# Patient Record
Sex: Male | Born: 1945 | Race: White | Hispanic: No | Marital: Married | Smoking: Former smoker
Health system: Southern US, Community
[De-identification: ages and names within clinical notes are randomized; demographics above are authoritative.]

## PROBLEM LIST (undated history)

## (undated) DIAGNOSIS — H269 Unspecified cataract: Secondary | ICD-10-CM

## (undated) DIAGNOSIS — F419 Anxiety disorder, unspecified: Secondary | ICD-10-CM

## (undated) DIAGNOSIS — I719 Aortic aneurysm of unspecified site, without rupture: Secondary | ICD-10-CM

## (undated) DIAGNOSIS — E785 Hyperlipidemia, unspecified: Secondary | ICD-10-CM

## (undated) DIAGNOSIS — E079 Disorder of thyroid, unspecified: Secondary | ICD-10-CM

## (undated) DIAGNOSIS — R011 Cardiac murmur, unspecified: Secondary | ICD-10-CM

## (undated) DIAGNOSIS — N289 Disorder of kidney and ureter, unspecified: Secondary | ICD-10-CM

## (undated) DIAGNOSIS — K635 Polyp of colon: Secondary | ICD-10-CM

## (undated) DIAGNOSIS — I10 Essential (primary) hypertension: Secondary | ICD-10-CM

## (undated) DIAGNOSIS — T7840XA Allergy, unspecified, initial encounter: Secondary | ICD-10-CM

## (undated) HISTORY — PX: CATARACT EXTRACTION: SUR2

## (undated) HISTORY — PX: EYE SURGERY: SHX253

## (undated) HISTORY — DX: Allergy, unspecified, initial encounter: T78.40XA

## (undated) HISTORY — DX: Aortic aneurysm of unspecified site, without rupture: I71.9

## (undated) HISTORY — DX: Disorder of thyroid, unspecified: E07.9

## (undated) HISTORY — PX: VASECTOMY: SHX75

## (undated) HISTORY — PX: ABDOMINAL AORTIC ANEURYSM REPAIR: SUR1152

## (undated) HISTORY — DX: Essential (primary) hypertension: I10

## (undated) HISTORY — DX: Anxiety disorder, unspecified: F41.9

## (undated) HISTORY — DX: Disorder of kidney and ureter, unspecified: N28.9

## (undated) HISTORY — DX: Hyperlipidemia, unspecified: E78.5

## (undated) HISTORY — DX: Polyp of colon: K63.5

## (undated) HISTORY — PX: APPENDECTOMY: SHX54

## (undated) HISTORY — DX: Unspecified cataract: H26.9

## (undated) HISTORY — DX: Cardiac murmur, unspecified: R01.1

---

## 2004-02-12 ENCOUNTER — Emergency Department: Payer: Self-pay | Admitting: Emergency Medicine

## 2004-10-18 ENCOUNTER — Other Ambulatory Visit: Payer: Self-pay

## 2004-10-18 ENCOUNTER — Emergency Department: Payer: Self-pay | Admitting: Unknown Physician Specialty

## 2005-01-29 ENCOUNTER — Other Ambulatory Visit: Payer: Self-pay

## 2005-01-29 ENCOUNTER — Emergency Department: Payer: Self-pay | Admitting: Emergency Medicine

## 2006-11-07 IMAGING — CT CT HEAD WITHOUT CONTRAST
2 series · 16 of 30 positions shown, 20 images · non-contrast
Comparison: none

REASON FOR EXAM: sob  near syncope  rm19
COMMENTS:

PROCEDURE:     CT  - CT HEAD WITHOUT CONTRAST  - October 18, 2004  [DATE]
RESULT:       No intra-axial or extra-axial pathologic fluid or blood
collections are identified.  No mass lesion is noted.
Initial report was given by the [HOSPITAL] at the time of the study.

[Series 2: without · axial · non-contrast · 0.44mm/px · z∈[-222,-88]mm · 13 of 33 slices shown, 17 images]
[im 3/33  brain]
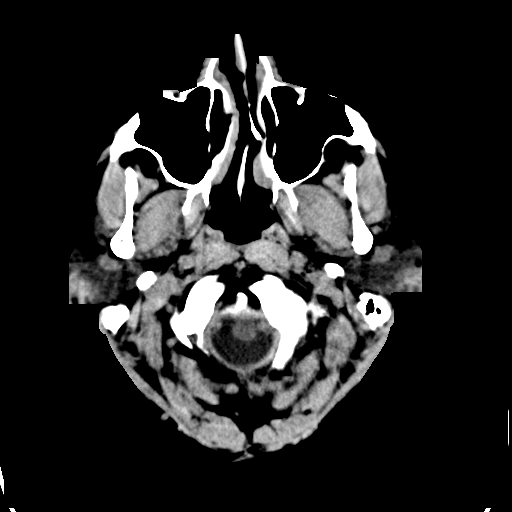
[im 3/33  bone]
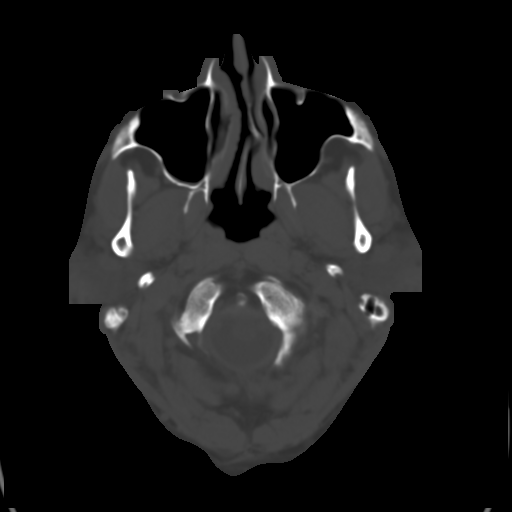
[im 5/33  brain]
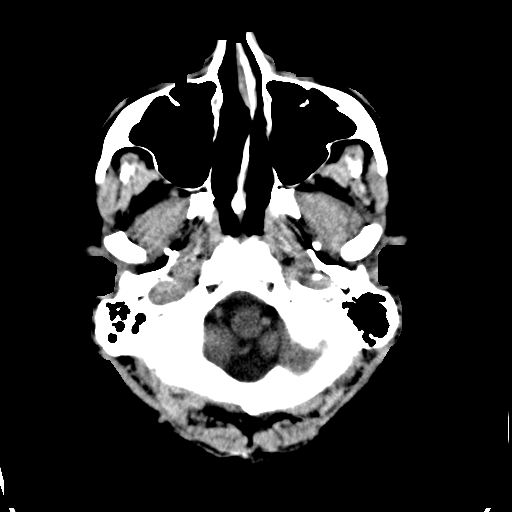
[im 7/33  brain]
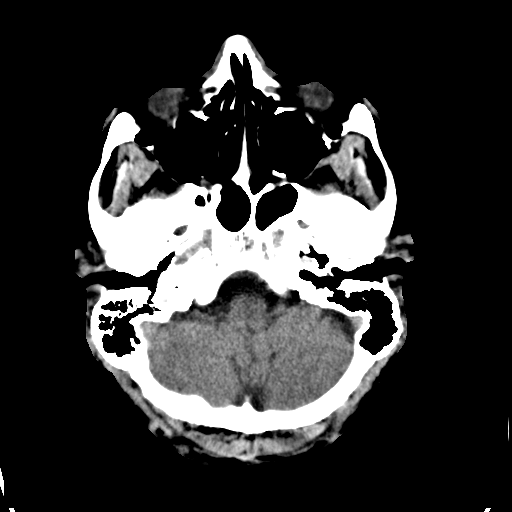
[im 10/33  brain]
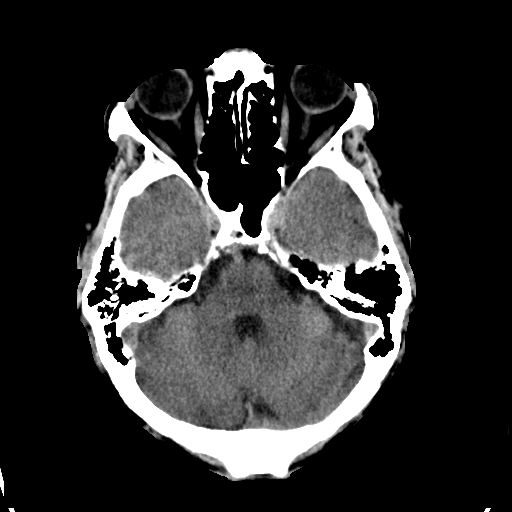
[im 12/33  brain]
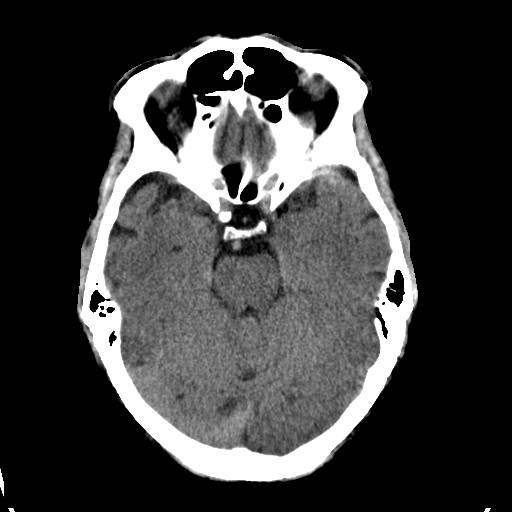
[im 12/33  bone]
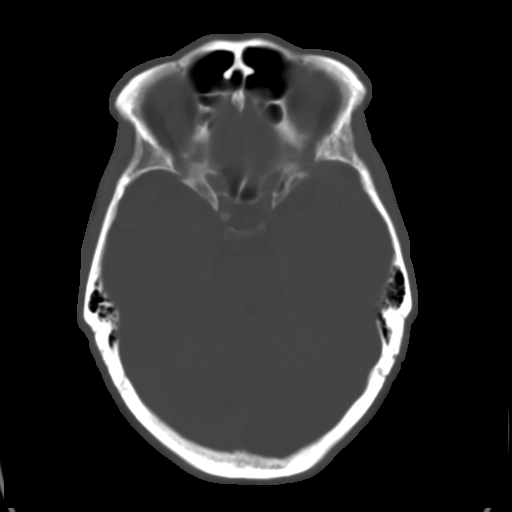
[im 14/33  brain]
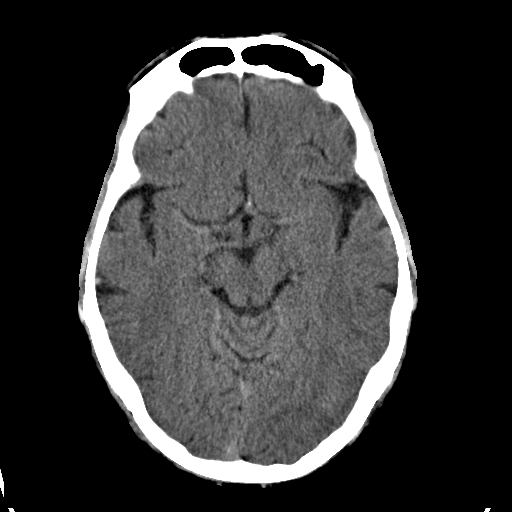
[im 17/33  brain]
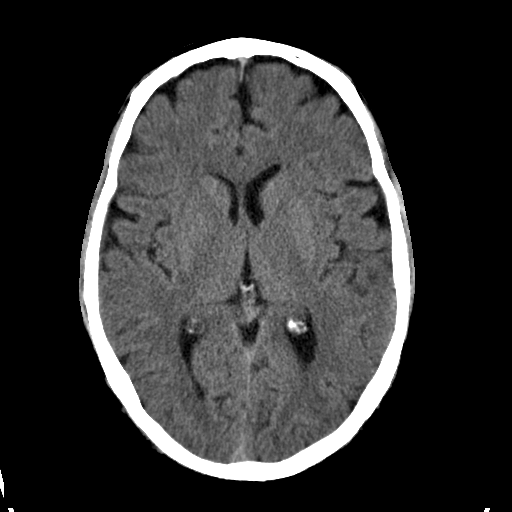
[im 19/33  brain]
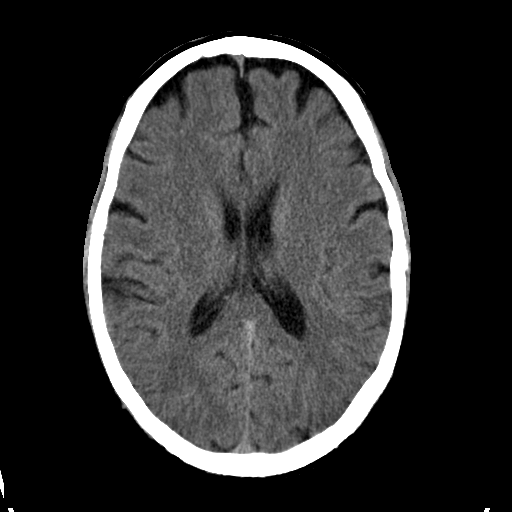
[im 21/33  brain]
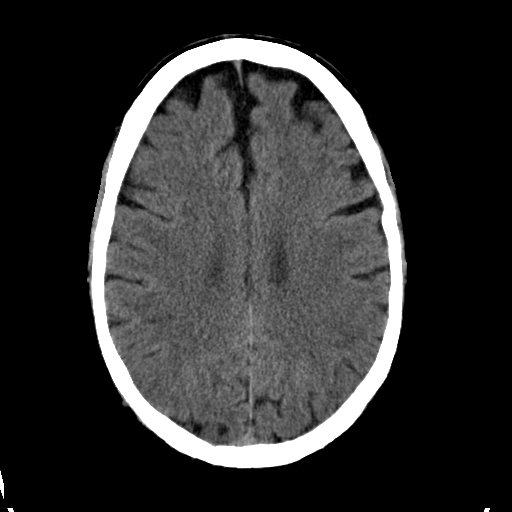
[im 21/33  bone]
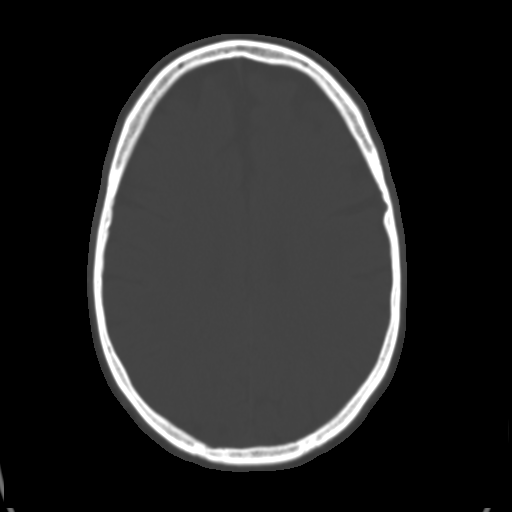
[im 23/33  brain]
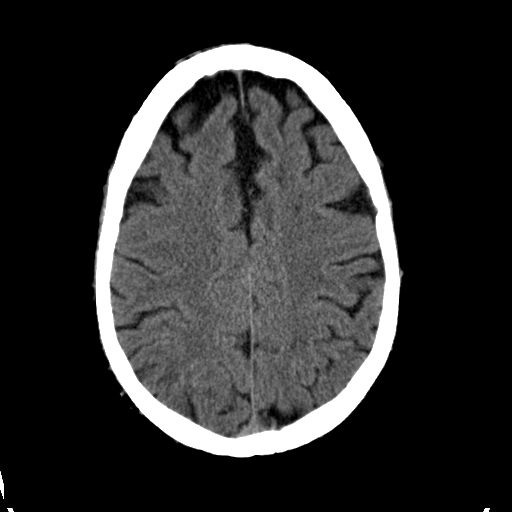
[im 26/33  brain]
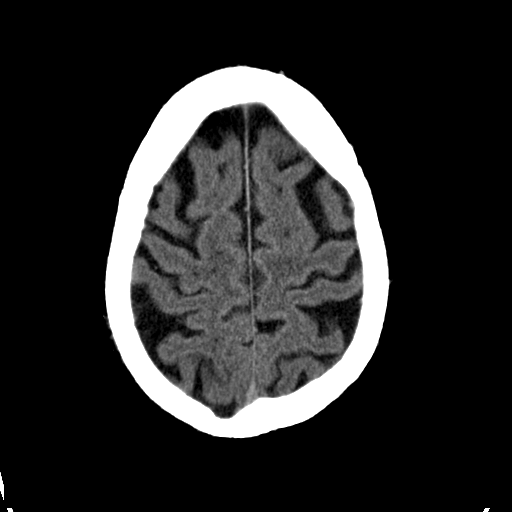
[im 28/33  brain]
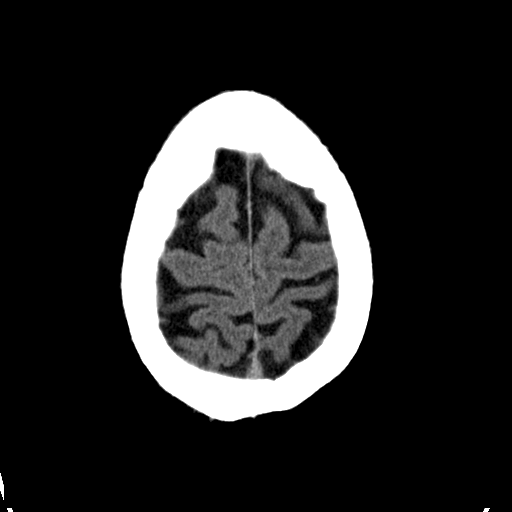
[im 30/33  brain]
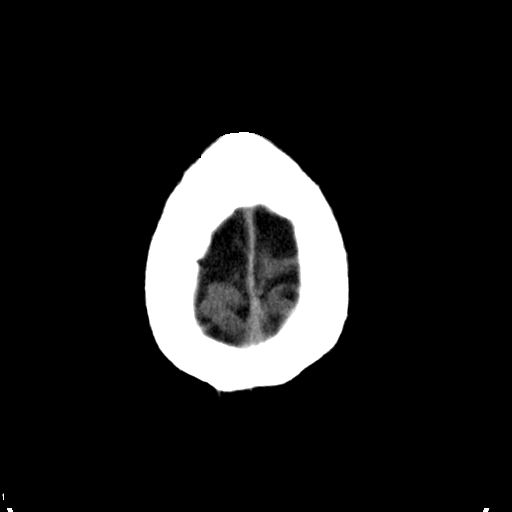
[im 30/33  bone]
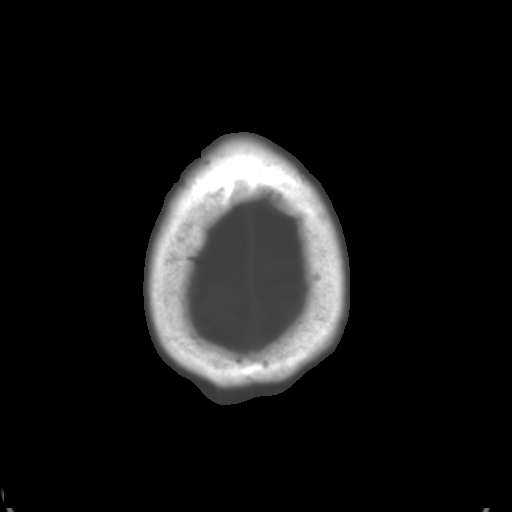

[Series 3: bone windows · axial · 0.44mm/px · z∈[-222,-178]mm · 3 of 33 slices shown]
[im 3/33  bone]
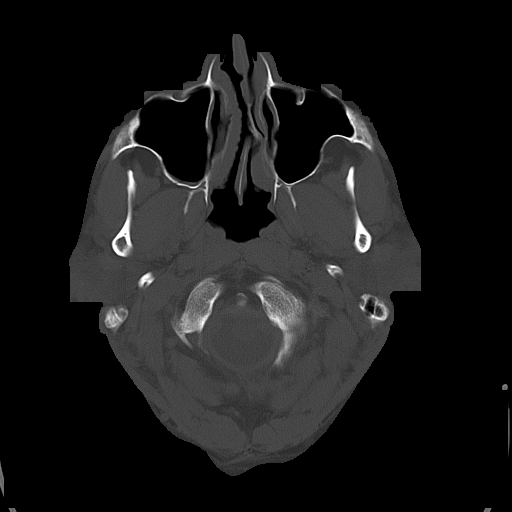
[im 7/33  bone]
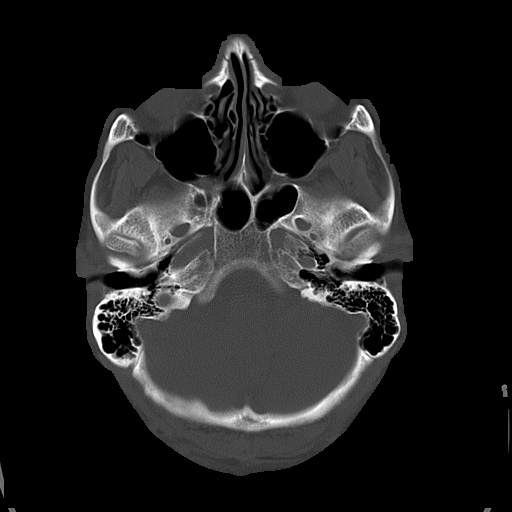
[im 12/33  bone]
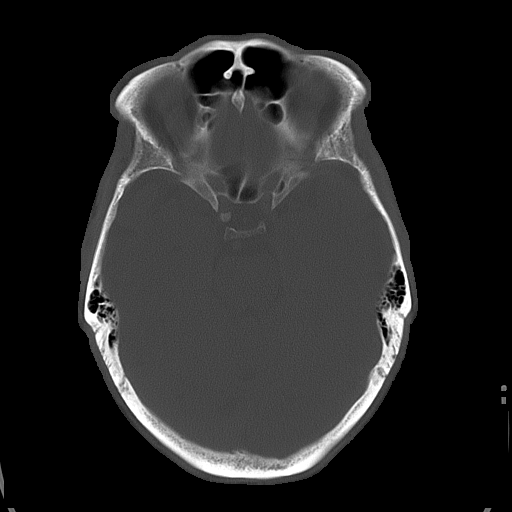

[16 of 30 positions shown; findings below may reference images not displayed]

IMPRESSION: No acute intracranial abnormalities identified.

## 2007-08-04 ENCOUNTER — Ambulatory Visit: Payer: Self-pay | Admitting: Unknown Physician Specialty

## 2009-03-16 ENCOUNTER — Emergency Department: Payer: Self-pay | Admitting: Emergency Medicine

## 2010-04-05 IMAGING — CR DG CHEST 2V
1 series · 2 of 2 positions shown · non-contrast
Comparison: none

REASON FOR EXAM: injury
COMMENTS:   LMP: (Male)

PROCEDURE:     DXR - DXR CHEST PA (OR AP) AND LATERAL  - March 16, 2009 [DATE]
RESULT:     The lung fields are clear. The heart, mediastinal and osseous
structures show no acute changes.

[Series 1: view not recorded · 0.17mm/px · 2 of 2 slices shown]
[im 1/2]
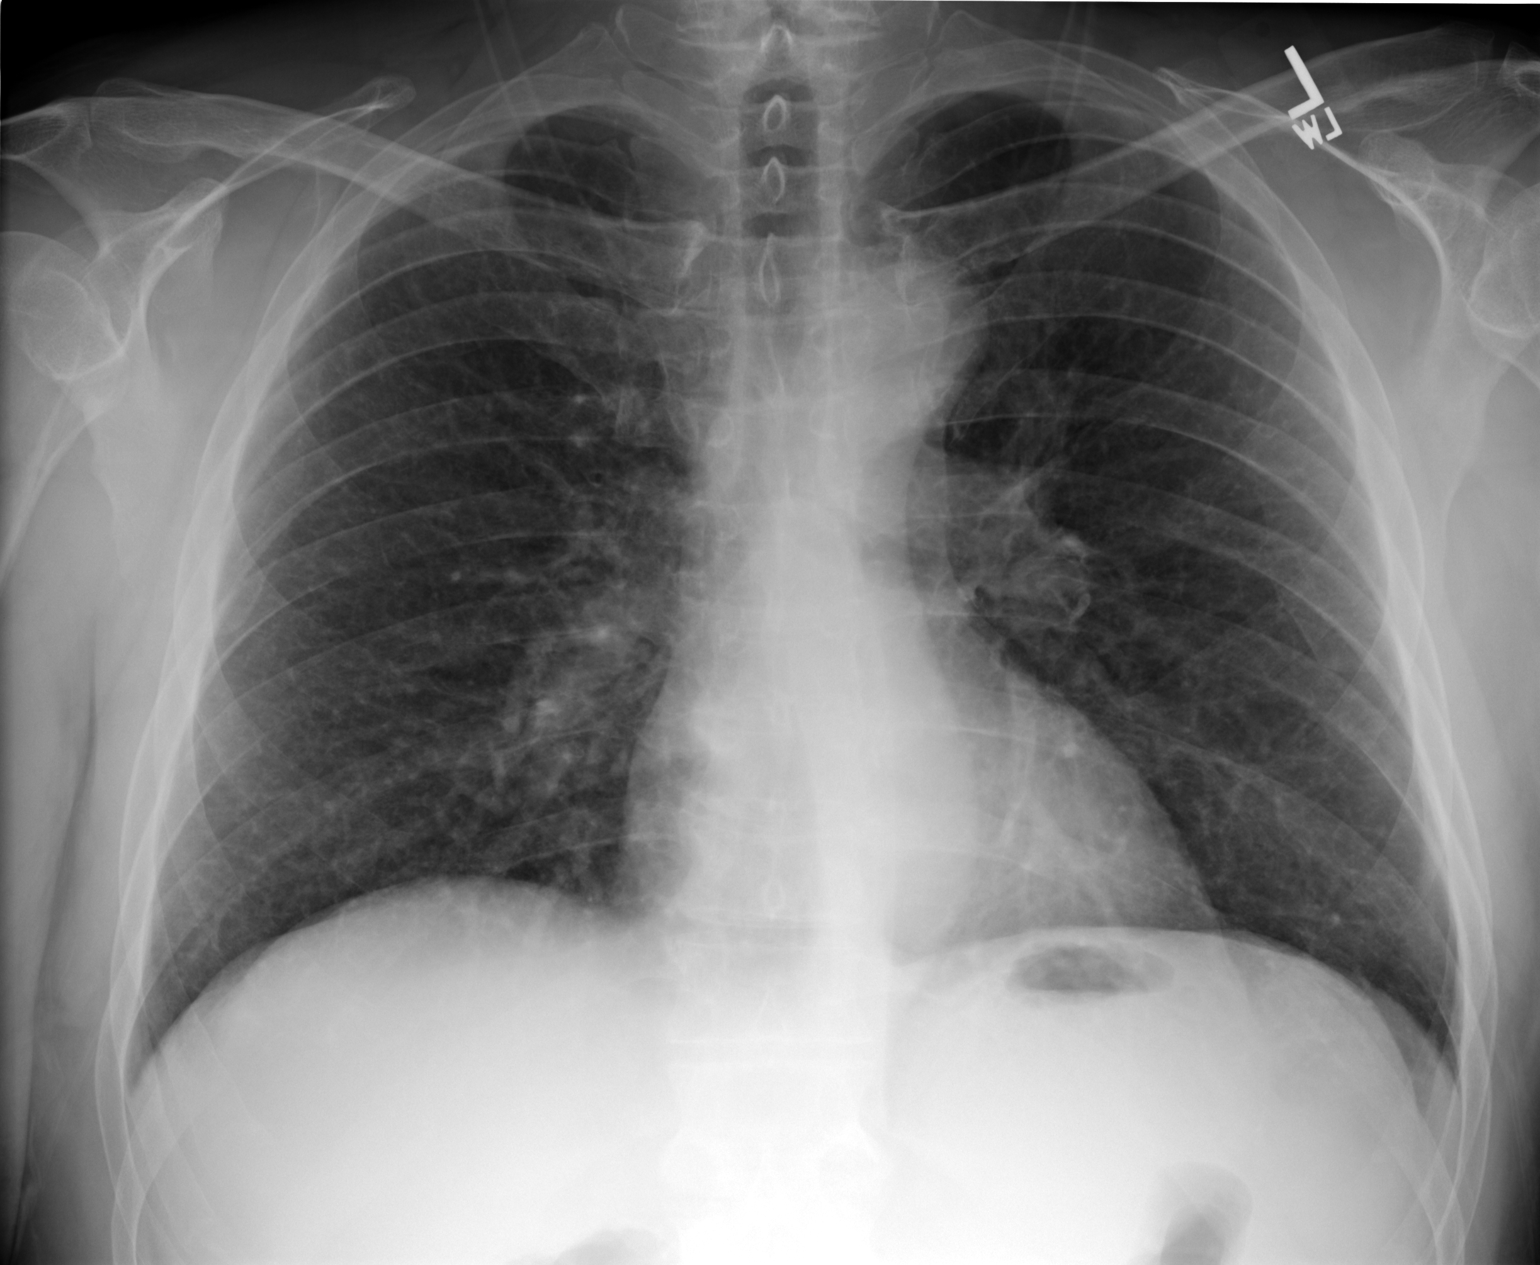
[im 2/2]
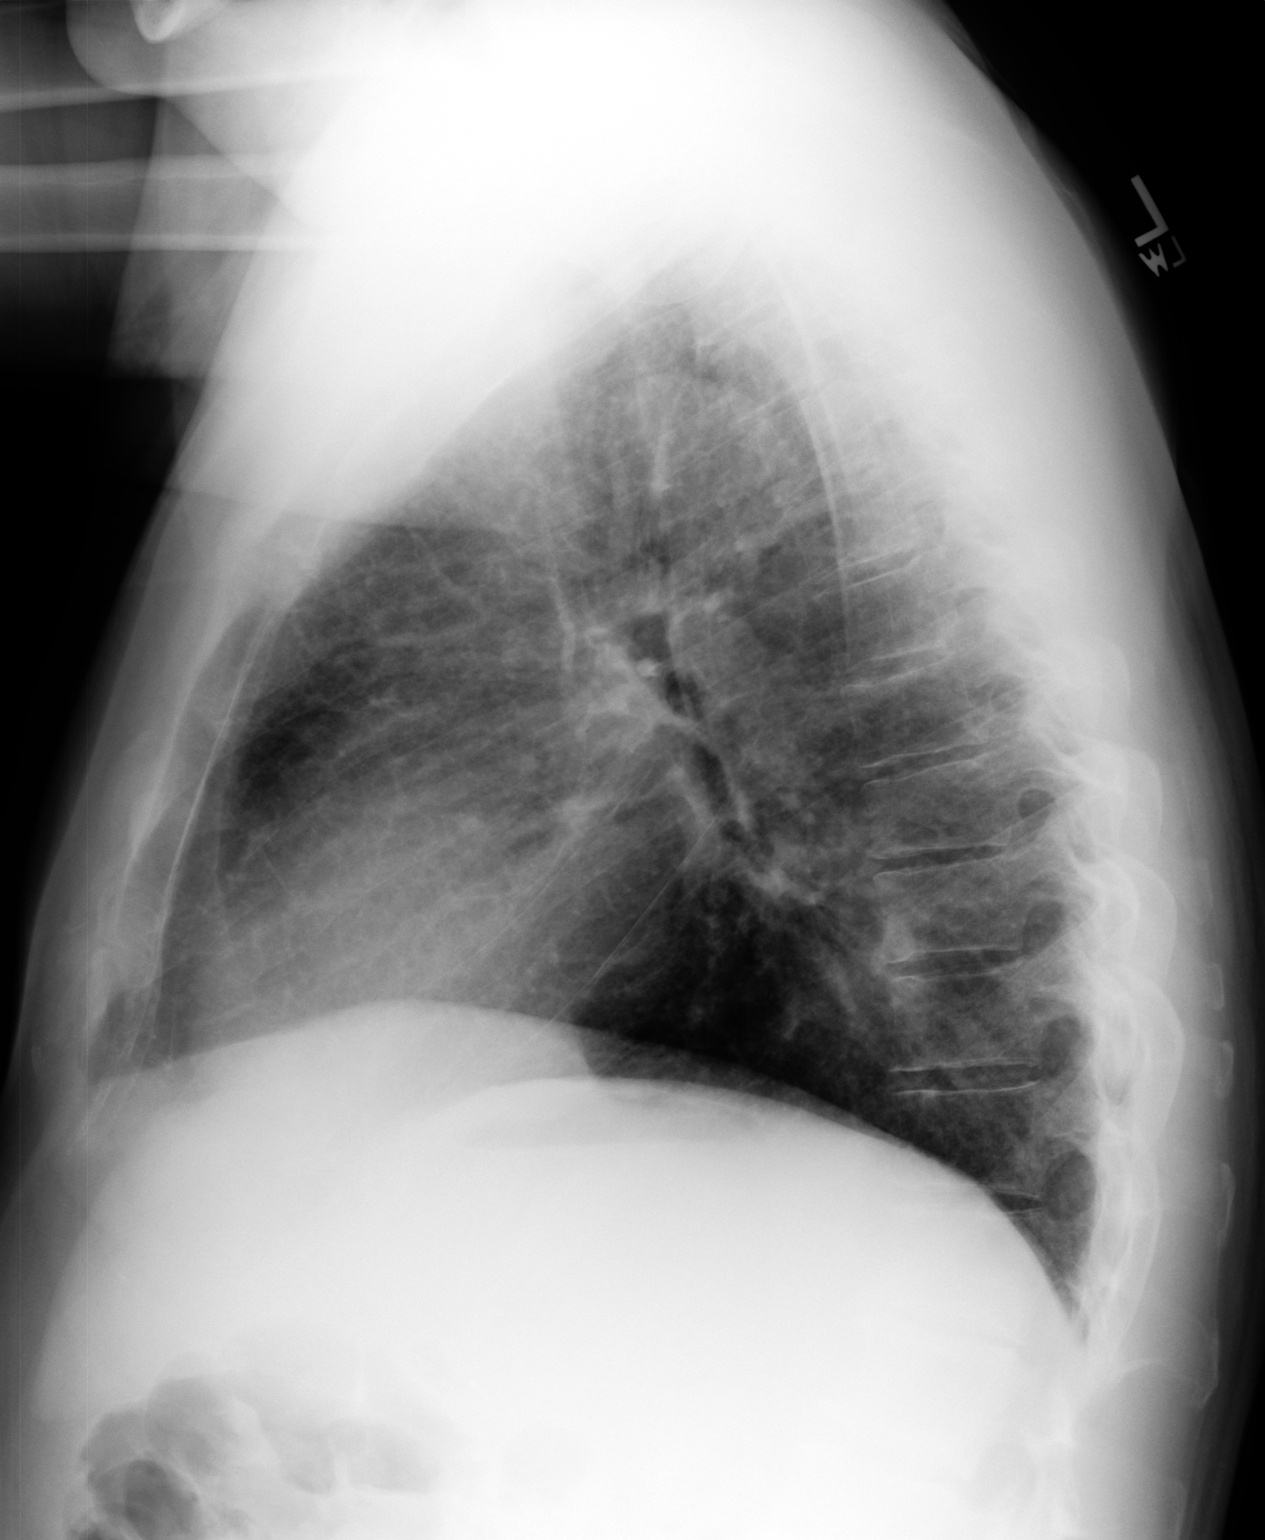

[2 of 2 positions shown; findings below may reference images not displayed]

IMPRESSION: 1.     No significant abnormalities are noted.

## 2015-03-08 DIAGNOSIS — H9193 Unspecified hearing loss, bilateral: Secondary | ICD-10-CM | POA: Insufficient documentation

## 2017-04-16 HISTORY — PX: ABDOMINAL AORTIC ANEURYSM REPAIR: SUR1152

## 2021-02-08 LAB — HM COLONOSCOPY

## 2022-02-15 ENCOUNTER — Ambulatory Visit: Payer: Self-pay | Admitting: Medical-Surgical

## 2022-02-15 ENCOUNTER — Encounter: Payer: Self-pay | Admitting: Family Medicine

## 2022-02-15 ENCOUNTER — Ambulatory Visit (INDEPENDENT_AMBULATORY_CARE_PROVIDER_SITE_OTHER): Payer: Federal, State, Local not specified - PPO | Admitting: Family Medicine

## 2022-02-15 VITALS — BP 133/83 | HR 80 | Ht 72.0 in | Wt 222.0 lb

## 2022-02-15 DIAGNOSIS — Z23 Encounter for immunization: Secondary | ICD-10-CM | POA: Diagnosis not present

## 2022-02-15 DIAGNOSIS — I10 Essential (primary) hypertension: Secondary | ICD-10-CM

## 2022-02-15 DIAGNOSIS — R7303 Prediabetes: Secondary | ICD-10-CM | POA: Diagnosis not present

## 2022-02-15 DIAGNOSIS — E039 Hypothyroidism, unspecified: Secondary | ICD-10-CM | POA: Insufficient documentation

## 2022-02-15 DIAGNOSIS — N1831 Chronic kidney disease, stage 3a: Secondary | ICD-10-CM

## 2022-02-15 NOTE — Assessment & Plan Note (Signed)
-   per patient report. Likely related to hx of htn  - will obtain CMP to further evaluate kidney disease  - pt is already on lisinopril which is kidney protective

## 2022-02-15 NOTE — Progress Notes (Signed)
New Patient Office Visit  Subjective    Patient ID: Earl [name], male    DOB: 07-31-1945  Age: 76 y.o. MRN: [id number]  CC:  Chief Complaint  Patient presents with   Establish Care    HPI Earl [name] presents to establish care.  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   fluticasone (FLONASE) 50 MCG/ACT nasal spray Place 1 spray into both nostrils daily.   hydrochlorothiazide (HYDRODIURIL) 25 MG tablet Take 25 mg by mouth daily.   levothyroxine (SYNTHROID) 88 MCG tablet Take 88 mcg by mouth daily.   lisinopril (ZESTRIL) 40 MG tablet Take 1 tablet by mouth daily.   lovastatin (MEVACOR) 40 MG tablet Take 1 tablet by mouth at bedtime.   sildenafil (VIAGRA) 100 MG tablet Take by mouth.   [DISCONTINUED] quinapril (ACCUPRIL) 40 MG tablet Take by mouth.   No facility-administered encounter medications on file as of 02/15/2022.    Past Medical History:  Diagnosis Date   Aorta aneurysm ([place])    Colon polyp    Hyperlipidemia    Hypertension    Kidney disease      Family History  Problem Relation Age of Onset   Diabetes Other     Social History   Socioeconomic History   Marital status: Married    Spouse name: Not on file   Number of children: Not on file   Years of education: Not on file   Highest education level: Not on file  Occupational History   Not on file  Tobacco Use   Smoking status: Not on  file   Smokeless tobacco: Not on file  Substance and Sexual Activity   Alcohol use: Not on file   Drug use: Not on file   Sexual activity: Not on file  Other Topics Concern   Not on file  Social History Narrative   Not on file   Social Determinants of Health   Financial Resource Strain: Not on file  Food Insecurity: Not on file  Transportation Needs: Not on file  Physical Activity: Not on file  Stress: Not on file  Social Connections: Not on file  Intimate Partner Violence: Not on file    Review of Systems  Constitutional:  Negative for chills and fever.  Respiratory:  Negative for cough and shortness of breath.   Cardiovascular:  Negative for chest pain.  Neurological:  Negative for headaches.        Objective    BP 133/83   Pulse 80   Ht 6' (1.829 m)   Wt 222 lb (100.7 kg)   SpO2 98%   BMI 30.11 kg/m   Physical Exam Vitals and nursing note reviewed.  Constitutional:      General: He is not in acute distress.    Appearance: Normal appearance.  HENT:     Head: Normocephalic and  atraumatic.     Right Ear: External ear normal.     Left Ear: External ear normal.     Nose: Nose normal.  Eyes:     Conjunctiva/sclera: Conjunctivae normal.  Cardiovascular:     Rate and Rhythm: Normal rate and regular rhythm.  Pulmonary:     Effort: Pulmonary effort is normal.     Breath sounds: Normal breath sounds.  Neurological:     General: No focal deficit present.     Mental Status: He is alert and oriented to person, place, and time.  Psychiatric:        Mood and Affect: Mood normal.        Behavior: Behavior normal.        Thought Content: Thought content normal.        Judgment: Judgment normal.        Assessment & Plan:   Problem List Items Addressed This Visit       Cardiovascular and Mediastinum   Primary hypertension - Primary    - BP well controlled will continue current management plan  - order CBC, CMP, lipid panel      Relevant Medications    sildenafil (VIAGRA) 100 MG tablet   lovastatin (MEVACOR) 40 MG tablet   lisinopril (ZESTRIL) 40 MG tablet   hydrochlorothiazide (HYDRODIURIL) 25 MG tablet   aspirin 81 MG chewable tablet   Other Relevant Orders   COMPLETE METABOLIC PANEL WITH GFR   Lipid panel     Endocrine   Acquired hypothyroidism    - secondary to agent orange in [place] - have ordered TSH to evaluate and make sure patient is on appropriate dosing - does not currently have signs of thyroid dysfunction      Relevant Medications   levothyroxine (SYNTHROID) 88 MCG tablet   Other Relevant Orders   TSH + free T4     Genitourinary   Stage 3a chronic kidney disease (HCC)    - per patient report. Likely related to hx of htn  - will obtain CMP to further evaluate kidney disease  - pt is already on lisinopril which is kidney protective      Relevant Orders   COMPLETE METABOLIC PANEL WITH GFR     Other   Prediabetes    - last A1c was 5.7. will obtain A1c to make sure levels are stable  - continue diet and exercise control.      Relevant Orders   HgB A1c   Lipid panel   Other Visit Diagnoses     Flu vaccine need       Relevant Orders   Flu Vaccine QUAD 6+ mos PF IM (Fluarix Quad PF) (Completed)       Return in about 6 months (around 08/16/2022).   Charlton Amor, DO

## 2022-02-15 NOTE — Assessment & Plan Note (Signed)
-   BP well controlled will continue current management plan  - order CBC, CMP, lipid panel

## 2022-02-15 NOTE — Assessment & Plan Note (Signed)
-   last A1c was 5.7. will obtain A1c to make sure levels are stable  - continue diet and exercise control.

## 2022-02-15 NOTE — Assessment & Plan Note (Signed)
-   secondary to agent orange in [place] - have ordered TSH to evaluate and make sure patient is on appropriate dosing - does not currently have signs of thyroid dysfunction

## 2022-02-16 LAB — COMPLETE METABOLIC PANEL WITHOUT GFR
AG Ratio: 1.8 (calc) (ref 1.0–2.5)
ALT: 24 U/L (ref 9–46)
AST: 25 U/L (ref 10–35)
Albumin: 4.9 g/dL (ref 3.6–5.1)
Alkaline phosphatase (APISO): 56 U/L (ref 35–144)
BUN/Creatinine Ratio: 11 (calc) (ref 6–22)
BUN: 16 mg/dL (ref 7–25)
CO2: 29 mmol/L (ref 20–32)
Calcium: 10.3 mg/dL (ref 8.6–10.3)
Chloride: 100 mmol/L (ref 98–110)
Creat: 1.47 mg/dL — ABNORMAL HIGH (ref 0.70–1.28)
Globulin: 2.7 g/dL (ref 1.9–3.7)
Glucose, Bld: 115 mg/dL — ABNORMAL HIGH (ref 65–99)
Potassium: 4.5 mmol/L (ref 3.5–5.3)
Sodium: 140 mmol/L (ref 135–146)
Total Bilirubin: 1.3 mg/dL — ABNORMAL HIGH (ref 0.2–1.2)
Total Protein: 7.6 g/dL (ref 6.1–8.1)
eGFR: 49 mL/min/{1.73_m2} — ABNORMAL LOW

## 2022-02-16 LAB — TSH+FREE T4: TSH W/REFLEX TO FT4: 1.61 mIU/L (ref 0.40–4.50)

## 2022-02-16 LAB — LIPID PANEL
Cholesterol: 171 mg/dL
HDL: 39 mg/dL — ABNORMAL LOW
LDL Cholesterol (Calc): 102 mg/dL — ABNORMAL HIGH
Non-HDL Cholesterol (Calc): 132 mg/dL — ABNORMAL HIGH
Total CHOL/HDL Ratio: 4.4 (calc)
Triglycerides: 179 mg/dL — ABNORMAL HIGH

## 2022-02-16 LAB — HEMOGLOBIN A1C
Hgb A1c MFr Bld: 5.6 % of total Hgb (ref <5.7)
Mean Plasma Glucose: 114 mg/dL
eAG (mmol/L): 6.3 mmol/L

## 2022-03-15 ENCOUNTER — Ambulatory Visit (INDEPENDENT_AMBULATORY_CARE_PROVIDER_SITE_OTHER): Payer: Federal, State, Local not specified - PPO | Admitting: Family Medicine

## 2022-03-15 VITALS — BP 161/89 | HR 70 | Resp 20 | Ht 72.0 in | Wt 225.0 lb

## 2022-03-15 DIAGNOSIS — Z23 Encounter for immunization: Secondary | ICD-10-CM | POA: Diagnosis not present

## 2022-03-15 NOTE — Progress Notes (Signed)
Patient here for Tdap.  Location: Right Deltoid   Medical screening examination/treatment was performed by qualified clinical staff member and as supervising physician I was immediately available for consultation/collaboration. I have reviewed documentation and agree with assessment and plan.  Colbert Coyer. Tamera Punt, DO

## 2022-06-09 ENCOUNTER — Other Ambulatory Visit: Payer: Self-pay | Admitting: Family Medicine

## 2022-06-09 DIAGNOSIS — E034 Atrophy of thyroid (acquired): Secondary | ICD-10-CM

## 2022-07-05 ENCOUNTER — Ambulatory Visit
Admission: EM | Admit: 2022-07-05 | Discharge: 2022-07-05 | Disposition: A | Discharge status: Home / Self Care | Payer: Medicare Other | Attending: Family Medicine | Admitting: Family Medicine

## 2022-07-05 DIAGNOSIS — R55 Syncope and collapse: Secondary | ICD-10-CM | POA: Diagnosis not present

## 2022-07-05 DIAGNOSIS — R000 Tachycardia, unspecified: Secondary | ICD-10-CM | POA: Diagnosis not present

## 2022-07-05 DIAGNOSIS — R030 Elevated blood-pressure reading, without diagnosis of hypertension: Secondary | ICD-10-CM

## 2022-07-05 DIAGNOSIS — I10 Essential (primary) hypertension: Secondary | ICD-10-CM

## 2022-07-05 DIAGNOSIS — F418 Other specified anxiety disorders: Secondary | ICD-10-CM

## 2022-07-05 NOTE — ED Triage Notes (Addendum)
Pt states he's been under a lot of stress due to being in court past several weeks. Also having trouble sleeping, hasn't slept in two days. Says he feels like his heart is racing. Hx of HTN, irregular heartbeat and murmur.

## 2022-07-05 NOTE — ED Provider Notes (Addendum)
[facility]    CSN: [id number] Arrival date & time: 07/05/22  1127      History   Chief Complaint Chief Complaint  Patient presents with   Anxiety    "Feels like heart is racing"    HPI Earl [name] is a 77 y.o. male.   HPI  History of hypertension, hyperlipidemia, aortic aneurysm repair, kidney disease.  States that he is involved in a prolonged court case that he finds extremely stressful and upsetting.  He describes his court involvement as causing "PTSD".  This is not formally diagnosed. Patient states that when he has to go to court he cannot sleep for a couple days before he goes in.  The last 2 times he is gone and he develops tachycardia with a very rapid heartbeat, and "the room goes dark".  He has not fainted but he feels like he is about to.  He does not have any chest pain or shortness of breath.  Does not have a history of coronary artery disease. He is here today because he had to leave work early.  He presents with tachycardia and mild elevated blood pressure.  Past Medical History:  Diagnosis Date   Aorta aneurysm [place] [facility])    Colon polyp    Hyperlipidemia    Hypertension    Kidney disease     Patient Active Problem List   Diagnosis Date Noted   Primary hypertension 02/15/2022   Prediabetes 02/15/2022   Acquired hypothyroidism 02/15/2022   Stage 3a chronic kidney disease ([place]) 02/15/2022    Past Surgical History:  Procedure Laterality Date   VASECTOMY         Home Medications    Prior to Admission medications   Medication Sig Start Date End Date Taking? Authorizing Provider  RESTASIS 0.05 % ophthalmic emulsion Place 1 drop into both eyes 2 (two) times daily. 06/29/22  Yes [provider]  amLODipine (NORVASC) 10 MG tablet Take 10 mg by mouth daily.    [provider]  aspirin 81 MG chewable tablet Chew by mouth.    [provider]  fluticasone (FLONASE) 50 MCG/ACT nasal spray Place 1 spray into both nostrils  daily. 08/20/21   [provider]  hydrochlorothiazide (HYDRODIURIL) 25 MG tablet Take 25 mg by mouth daily.    [provider]  levothyroxine (SYNTHROID) 88 MCG tablet TAKE 1 TABLET BY MOUTH EVERY DAY 06/11/22   Elmo Putt S, DO  lisinopril (ZESTRIL) 40 MG tablet Take 1 tablet by mouth daily. 12/19/21   [provider]  lovastatin (MEVACOR) 40 MG tablet Take 1 tablet by mouth at bedtime. 04/29/07   [provider]  sildenafil (VIAGRA) 100 MG tablet Take by mouth.    [provider]    Family History Family History  Problem Relation Age of Onset   Diabetes Other     Social History Social History   Tobacco Use   Smoking status: Former    Packs/day: 1.00    Years: 15.00    Additional pack years: 0.00    Total pack years: 15.00    Types: Cigarettes    Start date: 2002    Quit date: 2017    Years since quitting: 7.2   Smokeless tobacco: Never  Substance Use Topics   Alcohol use: Yes   Drug use: Never     Allergies   Niacin   Review of Systems Review of Systems See HPI  Physical Exam Triage Vital Signs ED Triage Vitals  Enc Vitals Group     BP 07/05/22 1137 (!) 159/90     Pulse Rate 07/05/22 1137 (!) 102     Resp 07/05/22 1137 17     Temp 07/05/22 1137 (!) 97.5 F (36.4 C)     Temp Source 07/05/22 1137 Oral     SpO2 07/05/22 1137 96 %     Weight --      Height --      Head Circumference --      Peak Flow --      Pain Score 07/05/22 1138 0     Pain Loc --      Pain Edu? --      Excl. in [place]? --    No data found.  Updated Vital Signs BP (!) 159/90 (BP Location: Right Arm)   Pulse (!) 102   Temp (!) 97.5 F (36.4 C) (Oral)   Resp 17   SpO2 96%     Physical Exam Constitutional:      General: He is not in acute distress.    Appearance: He is well-developed.     Comments: Anxious/upset  HENT:     Head: Normocephalic and atraumatic.  Eyes:     Conjunctiva/sclera: Conjunctivae normal.     Pupils: Pupils are  equal, round, and reactive to light.  Cardiovascular:     Rate and Rhythm: Normal rate and regular rhythm.     Heart sounds: Normal heart sounds.  Pulmonary:     Effort: Pulmonary effort is normal. No respiratory distress.     Breath sounds: Normal breath sounds.  Abdominal:     General: There is no distension.     Palpations: Abdomen is soft.  Musculoskeletal:        General: Normal range of motion.     Cervical back: Normal range of motion.     Right lower leg: No edema.     Left lower leg: No edema.  Skin:    General: Skin is warm and dry.  Neurological:     Mental Status: He is alert.      UC Treatments / Results  Labs (all labs ordered are listed, but only abnormal results are displayed) Labs Reviewed - No data to display  EKG   Radiology No results found.  Procedures ED EKG  Date/Time: 07/05/2022 12:24 PM  Performed by: Raylene Everts, MD Authorized by: Raylene Everts, MD   ECG interpreted by ED Physician in the absence of a cardiologist: yes   Previous ECG:    Previous ECG:  Compared to current   Similarity:  No change Interpretation:    Interpretation: non-specific     Details:  LAFB Rate:    ECG rate:  93   ECG rate assessment: normal   Rhythm:    Rhythm: sinus rhythm   Ectopy:    Ectopy: none   QRS:    QRS axis:  Normal   QRS intervals:  Normal   QRS conduction: non-specific conduction delay   ST segments:    ST segments:  Normal T waves:    T waves: normal   Q waves:    Abnormal Q-waves: not present    (including critical care time)  Medications Ordered in UC Medications - No data to display  Initial Impression / Assessment and Plan / UC Course  I have reviewed the triage vital signs and the nursing notes.  Pertinent labs & imaging results that were available during my care of the patient  were reviewed by me and considered in my medical decision making (see chart for details).     Vasculopath with tachycardia and  presyncope associated with emotional stress.  He needs additional cardiac testing prior to being cleared to go back into court safely.  I have placed a cardiology referral.  He is advised to follow-up with his primary care doctor. Final Clinical Impressions(s) / UC Diagnoses   Final diagnoses:  Situational anxiety  Tachycardia with hypertension  Elevated blood pressure reading  Pre-syncope     Discharge Instructions      See cardiology as scheduled   ED Prescriptions   None    PDMP not reviewed this encounter.   Raylene Everts, MD 07/05/22 1224    Raylene Everts, MD 07/05/22 1226

## 2022-07-05 NOTE — Discharge Instructions (Signed)
See cardiology as scheduled.

## 2022-07-27 ENCOUNTER — Other Ambulatory Visit: Payer: Self-pay | Admitting: Family Medicine

## 2022-07-27 DIAGNOSIS — E034 Atrophy of thyroid (acquired): Secondary | ICD-10-CM

## 2022-08-14 NOTE — Progress Notes (Signed)
Referring-Yvonne Lily Peer, MD Reason for referral-palpitations  HPI: 77 year old male for evaluation of palpitations at request of Eustace Moore, MD.  Laboratories November 2023 showed TSH 1.61.  Patient has had previous abdominal aortic aneurysm repair.  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  Patient seen recently with complaints of palpitations that occurred in the setting of a prolonged court case.  Emergency room physician felt patient would require cardiology testing before he could go back to court safely.  We are asked to evaluate.  Patient is going through divorce and during the court proceedings was told he would no additional findings.  He was very upset and felt his heart racing with some associated chest tightness.  He otherwise has not had exertional chest pain.  No history of syncope.  He has dyspnea with more vigorous activities but not routine activities.  No orthopnea, PND or pedal edema.  Current Outpatient Medications  Medication Sig Dispense Refill   amLODipine (NORVASC) 10 MG tablet Take 10 mg by mouth daily.     aspirin 81 MG chewable tablet Chew by mouth.     fluticasone (FLONASE) 50 MCG/ACT nasal spray Place 1 spray into both nostrils daily.     hydrochlorothiazide (HYDRODIURIL) 25 MG tablet Take 25 mg by mouth daily.     levothyroxine (SYNTHROID) 88 MCG tablet TAKE 1 TABLET BY MOUTH EVERY DAY 90 tablet 1   lisinopril (ZESTRIL) 40 MG tablet Take 1 tablet by mouth daily.     lovastatin (MEVACOR) 40 MG tablet Take 1 tablet by mouth at bedtime.     metoprolol tartrate (LOPRESSOR) 100 MG tablet Take 2 hours prior to CT scan 1 tablet 0   RESTASIS 0.05 % ophthalmic emulsion Place 1 drop into both eyes 2 (two) times daily.     sildenafil (VIAGRA) 100 MG tablet Take by  mouth.     No current facility-administered medications for this visit.    Allergies  Allergen Reactions   Niacin Other (See Comments)     Past Medical History:  Diagnosis Date   Aorta aneurysm (HCC)    Colon polyp    Hyperlipidemia    Hypertension    Kidney disease     Past Surgical History:  Procedure Laterality Date   ABDOMINAL AORTIC ANEURYSM REPAIR     CATARACT EXTRACTION     VASECTOMY      Social History   Socioeconomic History   Marital status: Married    Spouse name: Not on file   Number of children: Not on file   Years of education: Not on file   Highest education level: Associate degree: occupational, Scientist, product/process development, or vocational program  Occupational History   Not on file  Tobacco Use   Smoking status: Former    Packs/day: 1.00    Years: 15.00    Additional pack years: 0.00    Total pack years: 15.00    Types: Cigarettes    Start date: 2002    Quit date: 2017    Years since quitting: 7.3   Smokeless tobacco: Never  Substance and Sexual Activity   Alcohol use: Yes   Drug use: Never   Sexual activity: Not on file  Other Topics Concern   Not on file  Social History Narrative  Not on file   Social Determinants of Health   Financial Resource Strain: Not on file  Food Insecurity: Not on file  Transportation Needs: No Transportation Needs (08/17/2022)   PRAPARE - Administrator, Civil Service (Medical): No    Lack of Transportation (Non-Medical): No  Physical Activity: Insufficiently Active (08/17/2022)   Exercise Vital Sign    Days of Exercise per Week: 1 day    Minutes of Exercise per Session: 20 min  Stress: Stress Concern Present (08/17/2022)   [employer] of Occupational Health - Occupational Stress Questionnaire    Feeling of Stress : Very much  Social Connections: Socially Integrated (08/17/2022)   Social Connection and Isolation Panel [NHANES]    Frequency of Communication with Friends and Family: Three times a week     Frequency of Social Gatherings with Friends and Family: Three times a week    Attends Religious Services: More than 4 times per year    Active Member of Clubs or Organizations: Yes    Attends Engineer, structural: More than 4 times per year    Marital Status: Married  Catering manager Violence: Not on file    Family History  Problem Relation Age of Onset   Atrial fibrillation Father    Diabetes Other     ROS: no fevers or chills, productive cough, hemoptysis, dysphasia, odynophagia, melena, hematochezia, dysuria, hematuria, rash, seizure activity, orthopnea, PND, pedal edema, claudication. Remaining systems are negative.  Physical Exam:   Blood pressure 132/80, pulse 70, height 6' (1.829 m), weight 223 lb 0.6 oz (101.2 kg).  General:  Well developed/well nourished in NAD Skin warm/dry Patient not depressed No peripheral clubbing Back-normal HEENT-normal/normal eyelids Neck supple/normal carotid upstroke bilaterally; no bruits; no JVD; no thyromegaly chest - CTA/ normal expansion CV - RRR/normal S1 and S2; no murmurs, rubs or gallops;  PMI nondisplaced Abdomen -NT/ND, no HSM, no mass, + bowel sounds, no bruit 2+ femoral pulses, no bruits Ext-no edema, chords, 2+ DP Neuro-grossly nonfocal  ECG -July 05, 2022-normal sinus rhythm, left axis deviation.  Personally reviewed  A/P  1 palpitations-only occurred during court hearings.  Will consider monitor in the future if necessary.  2 hypertension-blood pressure controlled.  Continue present medical regimen.  3 hyperlipidemia-continue statin.  4 history of abdominal aortic aneurysm repair-will need follow-up ultrasound June 2024.  5 chest tightness-noted some chest tightness with palpitations.  He does have a history of vascular disease.  Will arrange cardiac CTA to rule out obstructive coronary disease.  Olga Millers, MD

## 2022-08-20 ENCOUNTER — Encounter: Payer: Self-pay | Admitting: Cardiology

## 2022-08-20 ENCOUNTER — Ambulatory Visit (INDEPENDENT_AMBULATORY_CARE_PROVIDER_SITE_OTHER): Payer: Medicare Other | Admitting: Cardiology

## 2022-08-20 VITALS — BP 132/80 | HR 70 | Ht 72.0 in | Wt 223.0 lb

## 2022-08-20 DIAGNOSIS — I10 Essential (primary) hypertension: Secondary | ICD-10-CM | POA: Diagnosis not present

## 2022-08-20 DIAGNOSIS — R072 Precordial pain: Secondary | ICD-10-CM | POA: Diagnosis not present

## 2022-08-20 DIAGNOSIS — E7800 Pure hypercholesterolemia, unspecified: Secondary | ICD-10-CM

## 2022-08-20 MED ORDER — METOPROLOL TARTRATE 100 MG PO TABS: ORAL_TABLET | ORAL | 0 refills | Status: DC

## 2022-08-20 NOTE — Patient Instructions (Signed)
  Testing/Procedures:    Your cardiac CT will be scheduled at   [facility] [address] [place], [place] [address] [phone]    If scheduled at [facility], please arrive at the [facility] and Children's Entrance (Entrance C2) of [facility] 30 minutes prior to test start time. You can use the FREE valet parking offered at entrance C (encouraged to control the heart rate for the test)  Proceed to the [facility] Radiology Department (first floor) to check-in and test prep.  All radiology patients and guests should use entrance C2 at [facility], accessed from [facility], even though the hospital's physical address listed is [address].     Please follow these instructions carefully (unless otherwise directed):  Hold all erectile dysfunction medications at least 3 days (72 hrs) prior to test. (Ie viagra, cialis, sildenafil, tadalafil, etc) We will administer nitroglycerin during this exam.   On the Night Before the Test: Be sure to Drink plenty of water. Do not consume any caffeinated/decaffeinated beverages or chocolate 12 hours prior to your test. Do not take any antihistamines 12 hours prior to your test.   On the Day of the Test: Drink plenty of water until 1 hour prior to the test. Do not eat any food 1 hour prior to test. You may take your regular medications prior to the test.  Take metoprolol (Lopressor) 100 mg two hours prior to test. DO NOT TAKE HCTZ THE MORNING OF THE CT [facility]       After the Test: Drink plenty of water. After receiving IV contrast, you may experience a mild flushed feeling. This is normal. On occasion, you may experience a mild rash up to 24 hours after the test. This is not dangerous. If this occurs, you can take Benadryl 25 mg and increase your fluid intake. If you experience trouble breathing, this can be serious. If it is severe call 911 IMMEDIATELY. If it is mild, please call  our office.  We will call to schedule your test 2-4 weeks out understanding that some insurance companies will need an authorization prior to the service being performed.   For non-scheduling related questions, please contact the cardiac imaging nurse navigator should you have any questions/concerns: Rockwell [place], Cardiac Imaging Nurse Navigator Larey Brick, Cardiac Imaging Nurse Navigator [facility] Direct Office Dial: [phone]   For scheduling needs, including cancellations and rescheduling, please call [place], [phone].    Follow-Up: At [facility], you and your health needs are our priority.  As part of our continuing mission to provide you with exceptional heart care, we have created designated Provider Care Teams.  These Care Teams include your primary Cardiologist (physician) and Advanced Practice Providers (APPs -  Physician Assistants and Nurse Practitioners) who all work together to provide you with the care you need, when you need it.  We recommend signing up for the patient portal called "MyChart".  Sign up information is provided on this After Visit Summary.  MyChart is used to connect with patients for Virtual Visits (Telemedicine).  Patients are able to view lab/test results, encounter notes, upcoming appointments, etc.  Non-urgent messages can be sent to your provider as well.   To learn more about what you can do with MyChart, go to [email or web address].    Your next appointment:   12 month(s)  Provider:   Olga Millers, MD

## 2022-08-21 ENCOUNTER — Ambulatory Visit (INDEPENDENT_AMBULATORY_CARE_PROVIDER_SITE_OTHER): Payer: Medicare Other | Admitting: Family Medicine

## 2022-08-21 ENCOUNTER — Encounter: Payer: Self-pay | Admitting: Family Medicine

## 2022-08-21 VITALS — BP 132/70 | HR 70 | Ht 72.0 in | Wt 224.2 lb

## 2022-08-21 DIAGNOSIS — I10 Essential (primary) hypertension: Secondary | ICD-10-CM

## 2022-08-21 DIAGNOSIS — R7303 Prediabetes: Secondary | ICD-10-CM | POA: Diagnosis not present

## 2022-08-21 DIAGNOSIS — F439 Reaction to severe stress, unspecified: Secondary | ICD-10-CM

## 2022-08-21 NOTE — Assessment & Plan Note (Signed)
-   pt has extreme amount of stress due to divorce disputes with ex wife that it has caused him heart problems - he is currently being followed by cardiology, have sent a letter

## 2022-08-21 NOTE — Assessment & Plan Note (Addendum)
-   BP well controlled, continue current management - will order BMP to assess elytes and kidney function

## 2022-08-21 NOTE — Progress Notes (Signed)
Established patient visit   Patient: Earl [name]   DOB: Oct 14, 1945   77 y.o. Male  MRN: [id number] Visit Date: 08/21/2022  Today's healthcare provider: Charlton Amor, DO   Chief Complaint  Patient presents with   6 month follow up    Patient states has been going through increased stress - (equitable distribution at court) - had to leave court due to racing HR- went to urgent careand they dx tachycardia- referred him to cardiology. Patient saw Dr. Jens Som yesterday and referred him for CT to check for blockages this has not yet been schld.  Patient doing well with cataract surgery.     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 Patient saw Dr. Jens Som yesterday and referred him for CT to check for blockages this has not yet been schld.  Patient doing well with cataract surgery.    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 Patient saw Dr. Jens Som yesterday and referred him for CT to check for blockages this has not yet been schld.  Patient doing well with cataract surgery.       Last edited by Elizabeth Palau, LPN on 10/22/2954  1:28 PM.      Pt presents for follow up on HTN follow.   HTN - lisinopril 40mg  - metoprolol tartrate 100mg   - His BP was 132/70   His ex is a full time cop and they are going through disputes. This has stressed him out drastically. Has increased stress level and saw cardiology tomorrow.   Colonoscopy two years ago was normal.   Review of Systems  Constitutional:  Negative for activity change,  fatigue and fever.  Respiratory:  Negative for cough and shortness of breath.   Cardiovascular:  Negative for chest pain.  Gastrointestinal:  Negative for abdominal pain.  Genitourinary:  Negative for difficulty urinating.       Current Meds  Medication Sig   amLODipine (NORVASC) 10 MG tablet Take 10 mg by mouth daily.   aspirin 81 MG chewable tablet Chew by mouth.   fluticasone (FLONASE) 50 MCG/ACT nasal spray Place 1 spray into both nostrils daily.   hydrochlorothiazide (HYDRODIURIL) 25 MG tablet Take 25 mg by mouth daily.   levothyroxine (SYNTHROID) 88 MCG tablet TAKE 1 TABLET BY MOUTH EVERY DAY   lisinopril (ZESTRIL) 40 MG tablet Take 1 tablet by mouth daily.   lovastatin (MEVACOR) 40 MG tablet Take 1 tablet by mouth at bedtime.   metoprolol tartrate (LOPRESSOR) 100 MG tablet Take 2 hours prior to CT scan   RESTASIS 0.05 % ophthalmic emulsion Place 1 drop into both eyes 2 (two) times daily.   sildenafil (VIAGRA) 100 MG tablet Take by mouth.    OBJECTIVE    BP 132/70   Pulse 70   Ht 6' (1.829 m)   Wt 224 lb 4 oz (101.7 kg)   SpO2 98%   BMI 30.41 kg/m   Physical Exam Vitals and nursing note reviewed.  Constitutional:  General: He is not in acute distress.    Appearance: Normal appearance.  HENT:     Head: Normocephalic and atraumatic.     Right Ear: External ear normal.     Left Ear: External ear normal.     Nose: Nose normal.  Eyes:     Conjunctiva/sclera: Conjunctivae normal.  Cardiovascular:     Rate and Rhythm: Normal rate and regular rhythm.  Pulmonary:     Effort: Pulmonary effort is normal.     Breath sounds: Normal breath sounds.  Neurological:     General: No focal deficit present.     Mental Status: He is alert and oriented to person, place, and time.  Psychiatric:        Mood and Affect: Mood normal.        Behavior: Behavior normal.        Thought Content: Thought content normal.        Judgment: Judgment normal.        ASSESSMENT & PLAN     Problem List Items Addressed This Visit       Cardiovascular and Mediastinum   Primary hypertension    - BP well controlled, continue current management - will order BMP to assess elytes and kidney function       Relevant Orders   BASIC METABOLIC PANEL WITH GFR     Other   Prediabetes - Primary    - will get A1c to assess level of prediabetes        Relevant Orders   HgB A1c   Stress    - pt has extreme amount of stress due to divorce disputes with ex wife that it has caused him heart problems - he is currently being followed by cardiology, have sent a letter       Return in about 6 months (around 02/21/2023).      No orders of the defined types were placed in this encounter.   Orders Placed This Encounter  Procedures   BASIC METABOLIC PANEL WITH GFR   HgB Z6X     Charlton Amor, DO  [facility] at [facility] [phone] (phone) [phone] (fax)  [facility]

## 2022-08-21 NOTE — Assessment & Plan Note (Signed)
-   will get A1c to assess level of prediabetes

## 2022-08-22 LAB — BASIC METABOLIC PANEL WITH GFR
BUN/Creatinine Ratio: 15 (calc) (ref 6–22)
BUN: 20 mg/dL (ref 7–25)
CO2: 26 mmol/L (ref 20–32)
Calcium: 9.9 mg/dL (ref 8.6–10.3)
Chloride: 99 mmol/L (ref 98–110)
Creat: 1.34 mg/dL — ABNORMAL HIGH (ref 0.70–1.28)
Glucose, Bld: 82 mg/dL (ref 65–99)
Potassium: 4.5 mmol/L (ref 3.5–5.3)
Sodium: 139 mmol/L (ref 135–146)
eGFR: 55 mL/min/{1.73_m2} — ABNORMAL LOW (ref >=60)

## 2022-08-22 LAB — HEMOGLOBIN A1C
Hgb A1c MFr Bld: 5.8 % of total Hgb — ABNORMAL HIGH (ref <5.7)
Mean Plasma Glucose: 120 mg/dL
eAG (mmol/L): 6.6 mmol/L

## 2022-08-31 ENCOUNTER — Telehealth (HOSPITAL_COMMUNITY): Payer: Self-pay | Admitting: Emergency Medicine

## 2022-08-31 NOTE — Telephone Encounter (Signed)
Reaching out to patient to offer assistance regarding upcoming cardiac imaging study; pt verbalizes understanding of appt date/time, parking situation and where to check in, pre-test NPO status and medications ordered, and verified current allergies; name and call back number provided for further questions should they arise Glendora Clouatre RN Navigator Cardiac Imaging [facility] [phone] office [phone] cell   100mg metoprolol tartrate  

## 2022-09-03 ENCOUNTER — Ambulatory Visit (HOSPITAL_COMMUNITY)
Admission: RE | Admit: 2022-09-03 | Discharge: 2022-09-03 | Disposition: A | Discharge status: Home / Self Care | Payer: Medicare Other | Source: Ambulatory Visit | Attending: Cardiology | Admitting: Cardiology

## 2022-09-03 DIAGNOSIS — R072 Precordial pain: Secondary | ICD-10-CM

## 2022-09-03 MED ORDER — NITROGLYCERIN 0.4 MG SL SUBL
SUBLINGUAL_TABLET | SUBLINGUAL | Status: AC
  Filled 2022-09-03: qty 2

## 2022-09-03 MED ORDER — NITROGLYCERIN 0.4 MG SL SUBL
0.8000 mg | SUBLINGUAL_TABLET | Freq: Once | SUBLINGUAL | Status: AC
  Administered 2022-09-03: 0.8 mg via SUBLINGUAL

## 2022-09-03 MED ORDER — IOHEXOL 350 MG/ML SOLN
95.0000 mL | Freq: Once | INTRAVENOUS | Status: AC | PRN
  Administered 2022-09-03: 95 mL via INTRAVENOUS

## 2022-09-04 ENCOUNTER — Encounter: Payer: Self-pay | Admitting: *Deleted

## 2022-09-04 ENCOUNTER — Telehealth: Payer: Self-pay | Admitting: Cardiology

## 2022-09-04 DIAGNOSIS — E7800 Pure hypercholesterolemia, unspecified: Secondary | ICD-10-CM

## 2022-09-04 MED ORDER — ROSUVASTATIN CALCIUM 40 MG PO TABS
40.0000 mg | ORAL_TABLET | Freq: Every day | ORAL | 3 refills | Status: DC
Start: 2022-09-04 — End: 2023-09-10

## 2022-09-04 NOTE — Telephone Encounter (Signed)
This encounter was created in error - please disregard.

## 2022-09-04 NOTE — Telephone Encounter (Signed)
Patient is returning call and is requesting return call.  

## 2022-09-04 NOTE — Telephone Encounter (Signed)
Returned call to patient. He said that Debra left him a message at about 0945 to call her back. I was unable to find what this was in regards to. Informed him that I will send this message to [facility] and she will call you back.

## 2022-09-04 NOTE — Telephone Encounter (Signed)
Spoke with pt, Aware of dr crenshaw's recommendations.  ?New script sent to the pharmacy  ?Lab orders mailed to the pt  ?

## 2022-09-05 ENCOUNTER — Ambulatory Visit (HOSPITAL_COMMUNITY)
Admission: RE | Admit: 2022-09-05 | Discharge: 2022-09-05 | Disposition: A | Discharge status: Home / Self Care | Payer: Medicare Other | Source: Ambulatory Visit | Attending: Cardiology | Admitting: Cardiology

## 2022-09-05 ENCOUNTER — Other Ambulatory Visit (HOSPITAL_COMMUNITY): Payer: Self-pay | Admitting: *Deleted

## 2022-09-05 DIAGNOSIS — R931 Abnormal findings on diagnostic imaging of heart and coronary circulation: Secondary | ICD-10-CM | POA: Diagnosis present

## 2022-09-18 ENCOUNTER — Ambulatory Visit (INDEPENDENT_AMBULATORY_CARE_PROVIDER_SITE_OTHER): Payer: Medicare Other | Admitting: Family Medicine

## 2022-09-18 DIAGNOSIS — Z0000 Encounter for general adult medical examination without abnormal findings: Secondary | ICD-10-CM | POA: Diagnosis not present

## 2022-09-18 NOTE — Progress Notes (Signed)
MEDICARE ANNUAL WELLNESS VISIT  09/18/2022  Telephone Visit Disclaimer This Medicare AWV was conducted by telephone due to national recommendations for restrictions regarding the COVID-19 Pandemic (e.g. social distancing).  I verified, using two identifiers, that I am speaking with Earl [name] or their authorized healthcare agent. I discussed the limitations, risks, security, and privacy concerns of performing an evaluation and management service by telephone and the potential availability of an in-person appointment in the future. The patient expressed understanding and agreed to proceed.  Location of Patient: Home Location of Provider (nurse):  In the office.  Subjective:    Earl [name] is a 77 y.o. male patient of Tamera Punt, Erika S, DO who had a Medicare Annual Wellness Visit today via telephone. Earl [name] is Retired and lives with their spouse. he has 2 children. he reports that he is socially active and does interact with friends/family regularly. he is moderately physically active and enjoys staying active in the community and church.  Patient Care Team: Charlton Amor, DO as PCP - General (Family Medicine)     09/18/2022    2:02 PM 02/15/2022   11:29 AM  Advanced Directives  Does Patient Have a Medical Advance Directive? No No  Would patient like information on creating a medical advance directive? Yes (MAU/Ambulatory/Procedural Areas - Information given) No - Patient declined    Hospital Utilization Over the Past 12 Months: # of hospitalizations or ER visits: 0 # of surgeries: 1  Review of Systems    Patient reports that his overall health is  worse due to anxiety  compared to last year.  History obtained from chart review and the patient  Patient Reported Readings (BP, Pulse, CBG, Weight, etc) none  Pain Assessment Pain : No/denies pain     Current Medications & Allergies (verified) Allergies as of 09/18/2022       Reactions   Niacin Other (See Comments)         Medication List        Accurate as of September 18, 2022  2:22 PM. If you have any questions, ask your nurse or doctor.          STOP taking these medications    fluticasone 50 MCG/ACT nasal spray Commonly known as: FLONASE   metoprolol tartrate 100 MG tablet Commonly known as: LOPRESSOR   sildenafil 100 MG tablet Commonly known as: VIAGRA       TAKE these medications    amLODipine 10 MG tablet Commonly known as: NORVASC Take 10 mg by mouth daily.   aspirin 81 MG chewable tablet Chew by mouth.   hydrochlorothiazide 25 MG tablet Commonly known as: HYDRODIURIL Take 25 mg by mouth daily.   levothyroxine 88 MCG tablet Commonly known as: SYNTHROID TAKE 1 TABLET BY MOUTH EVERY DAY   lisinopril 40 MG tablet Commonly known as: ZESTRIL Take 1 tablet by mouth daily.   Restasis 0.05 % ophthalmic emulsion Generic drug: cycloSPORINE Place 1 drop into both eyes 2 (two) times daily.   rosuvastatin 40 MG tablet Commonly known as: CRESTOR Take 1 tablet (40 mg total) by mouth daily.        History (reviewed): Past Medical History:  Diagnosis Date   Anxiety 09/03/2022   Court date for equitable distributions now in tenth year.   Aorta aneurysm (HCC)    Colon polyp    Heart murmur Years ago   None   Hyperlipidemia    Hypertension    Kidney disease  Thyroid disease During routine tests   Reaction to exposure to agent orange   Past Surgical History:  Procedure Laterality Date   ABDOMINAL AORTIC ANEURYSM REPAIR     CATARACT EXTRACTION     EYE SURGERY  April 2024   Cataract surgery/both eyes   VASECTOMY     Family History  Problem Relation Age of Onset   Diabetes Mother    Hypertension Mother    Atrial fibrillation Father    Kidney disease Father    Diabetes Other    Social History   Socioeconomic History   Marital status: Married    Spouse name: Dois Davenport   Number of children: 2   Years of education: 13   Highest education level: Some college, no  degree  Occupational History   Occupation: Retired.  Tobacco Use   Smoking status: Former    Packs/day: 1.00    Years: 15.00    Additional pack years: 0.00    Total pack years: 15.00    Types: Cigarettes    Start date: 2002    Quit date: 02/21/2015    Years since quitting: 7.5   Smokeless tobacco: Never  Vaping Use   Vaping Use: Never used  Substance and Sexual Activity   Alcohol use: Yes    Alcohol/week: 2.0 standard drinks of alcohol    Types: 2 Shots of liquor per week    Comment: One to two mixed drinks per month   Drug use: Never   Sexual activity: Yes    Birth control/protection: None    Comment: Vasectomy  Other Topics Concern   Not on file  Social History Narrative   Lives with his wife. He has two children and one step child. He enjoys staying active in the community and church.   Social Determinants of Health   Financial Resource Strain: Patient Declined (09/17/2022)   Overall Financial Resource Strain (CARDIA)    Difficulty of Paying Living Expenses: Patient declined  Food Insecurity: Patient Declined (09/17/2022)   Hunger Vital Sign    Worried About Running Out of Food in the Last Year: Patient declined    Ran Out of Food in the Last Year: Patient declined  Transportation Needs: No Transportation Needs (09/17/2022)   PRAPARE - Administrator, Civil Service (Medical): No    Lack of Transportation (Non-Medical): No  Physical Activity: Patient Declined (09/17/2022)   Exercise Vital Sign    Days of Exercise per Week: Patient declined    Minutes of Exercise per Session: Patient declined  Recent Concern: Physical Activity - Insufficiently Active (08/17/2022)   Exercise Vital Sign    Days of Exercise per Week: 1 day    Minutes of Exercise per Session: 20 min  Stress: Stress Concern Present (09/17/2022)   [employer] of Occupational Health - Occupational Stress Questionnaire    Feeling of Stress : Very much  Social Connections: Socially Integrated  (09/18/2022)   Social Connection and Isolation Panel [NHANES]    Frequency of Communication with Friends and Family: More than three times a week    Frequency of Social Gatherings with Friends and Family: Three times a week    Attends Religious Services: More than 4 times per year    Active Member of Clubs or Organizations: Yes    Attends Club or Organization Meetings: More than 4 times per year    Marital Status: Married    Activities of Daily Living    09/17/2022    7:17 PM  In  your present state of health, do you have any difficulty performing the following activities:  Hearing? 1  Vision? 0  Difficulty concentrating or making decisions? 0  Walking or climbing stairs? 0  Dressing or bathing? 0  Doing errands, shopping? 0  Preparing Food and eating ? N  Using the Toilet? N  In the past six months, have you accidently leaked urine? N  Do you have problems with loss of bowel control? N  Managing your Medications? N  Managing your Finances? N  Housekeeping or managing your Housekeeping? N    Patient Education/ Literacy How often do you need to have someone help you when you read instructions, pamphlets, or other written materials from your doctor or pharmacy?: 1 - Never What is the last grade level you completed in school?: 12th plus some college  Exercise Current Exercise Habits: The patient does not participate in regular exercise at present, Exercise limited by: None identified  Diet Patient reports consuming 2 meals a day and 1-2 snack(s) a day Patient reports that his primary diet is: Regular Patient reports that she does have regular access to food.   Depression Screen    09/18/2022    2:10 PM 08/21/2022    1:57 PM 02/15/2022   11:30 AM  PHQ 2/9 Scores  PHQ - 2 Score 0 0 0  PHQ- 9 Score   0     Fall Risk    09/18/2022    2:10 PM 09/17/2022    7:17 PM 08/21/2022    1:56 PM 02/15/2022   11:30 AM  Fall Risk   Falls in the past year? 0 0 1 0  Number falls in past yr: 0  0 1 0  Injury with Fall? 0 0 0 0  Risk for fall due to : No Fall Risks  History of fall(s) No Fall Risks  Follow up Falls evaluation completed  Falls evaluation completed Falls evaluation completed     Objective:  Earl [name] seemed alert and oriented and he participated appropriately during our telephone visit.  Blood Pressure Weight BMI  BP Readings from Last 3 Encounters:  09/03/22 123/78  08/21/22 132/70  08/20/22 132/80   Wt Readings from Last 3 Encounters:  08/21/22 224 lb 4 oz (101.7 kg)  08/20/22 223 lb 0.6 oz (101.2 kg)  03/15/22 225 lb (102.1 kg)   BMI Readings from Last 1 Encounters:  08/21/22 30.41 kg/m    *Unable to obtain current vital signs, weight, and BMI due to telephone visit type  Hearing/Vision  Earl [name] did not seem to have difficulty with hearing/understanding during the telephone conversation Reports that he has had a formal eye exam by an eye care professional within the past year Reports that he has had a formal hearing evaluation within the past year *Unable to fully assess hearing and vision during telephone visit type  Cognitive Function:    09/18/2022    2:15 PM  6CIT Screen  What Year? 0 points  What month? 0 points  What time? 0 points  Count back from 20 0 points  Months in reverse 0 points  Repeat phrase 0 points  Total Score 0 points   (Normal:0-7, Significant for Dysfunction: >8)  Normal Cognitive Function Screening: Yes   Immunization & Health Maintenance Record Immunization History  Administered Date(s) Administered   Fluad Quad(high Dose 65+) 01/11/2017, 02/17/2018, 01/01/2020, 02/13/2021   Influenza, High Dose Seasonal PF 03/08/2015   Influenza,inj,Quad PF,6+ Mos 02/15/2022   Influenza-Unspecified 04/29/2007, 12/02/2018  Moderna Sars-Covid-2 Vaccination 05/23/2019, 06/22/2019   Pneumococcal Conjugate-13 07/09/2016   Pneumococcal Polysaccharide-23 05/23/2018   Tdap 12/02/2018, 03/15/2022   Zoster Recombinat (Shingrix)  12/02/2018, 03/09/2019   Zoster, Live 12/13/2015    Health Maintenance  Topic Date Due   COVID-19 Vaccine (3 - 2023-24 season) 10/04/2022 (Originally 12/15/2021)   Hepatitis C Screening  09/18/2023 (Originally 11/30/1963)   INFLUENZA VACCINE  11/15/2022   Medicare Annual Wellness (AWV)  09/18/2023   Colonoscopy  02/09/2031   DTaP/Tdap/Td (3 - Td or Tdap) 03/15/2032   Pneumonia Vaccine 70+ Years old  Completed   Zoster Vaccines- Shingrix  Completed   HPV VACCINES  Aged Out   Lung Cancer Screening  Discontinued       Assessment  This is a routine wellness examination for [employer].  Health Maintenance: Due or Overdue There are no preventive care reminders to display for this patient.   Earl [name] does not need a referral for [employer] Assistance: Care Management:   no Social Work:    no Prescription Assistance:  no Nutrition/Diabetes Education:  no   Plan:  Personalized Goals  Goals Addressed               This Visit's Progress     Patient Stated (pt-stated)        Patient stated that he would like to loose 10 lbs.       Personalized Health Maintenance & Screening Recommendations  There are no preventive care reminders to display for this patient.   Lung Cancer Screening Recommended: no (Low Dose CT Chest recommended if Age 63-80 years, 20 pack-year currently smoking OR have quit w/in past 15 years) Hepatitis C Screening recommended: yes HIV Screening recommended: no  Advanced Directives: Written information was not prepared per patient's request.  Referrals & Orders No orders of the defined types were placed in this encounter.   Follow-up Plan Follow-up with Charlton Amor, DO as planned Medicare wellness visit in one year.  Patient will access AVS on my chart.   I have personally reviewed and noted the following in the patient's chart:   Medical and social history Use of alcohol, tobacco or illicit drugs  Current medications and  supplements Functional ability and status Nutritional status Physical activity Advanced directives List of other physicians Hospitalizations, surgeries, and ER visits in previous 12 months Vitals Screenings to include cognitive, depression, and falls Referrals and appointments  In addition, I have reviewed and discussed with Earl [name] certain preventive protocols, quality metrics, and best practice recommendations. A written personalized care plan for preventive services as well as general preventive health recommendations is available and can be mailed to the patient at his request.      Earl Charon, RN BSN  09/18/2022

## 2022-09-18 NOTE — Patient Instructions (Addendum)
MEDICARE ANNUAL WELLNESS VISIT Health Maintenance Summary and Written Plan of Care  Earl [name] ,  Thank you for allowing me to perform your Medicare Annual Wellness Visit and for your ongoing commitment to your health.   Health Maintenance & Immunization History Health Maintenance  Topic Date Due   COVID-19 Vaccine (3 - 2023-24 season) 10/04/2022 (Originally 12/15/2021)   Hepatitis C Screening  09/18/2023 (Originally 11/30/1963)   INFLUENZA VACCINE  11/15/2022   Medicare Annual Wellness (AWV)  09/18/2023   Colonoscopy  02/09/2031   DTaP/Tdap/Td (3 - Td or Tdap) 03/15/2032   Pneumonia Vaccine 37+ Years old  Completed   Zoster Vaccines- Shingrix  Completed   HPV VACCINES  Aged Out   Lung Cancer Screening  Discontinued   Immunization History  Administered Date(s) Administered   Fluad Quad(high Dose 65+) 01/11/2017, 02/17/2018, 01/01/2020, 02/13/2021   Influenza, High Dose Seasonal PF 03/08/2015   Influenza,inj,Quad PF,6+ Mos 02/15/2022   Influenza-Unspecified 04/29/2007, 12/02/2018   Moderna Sars-Covid-2 Vaccination 05/23/2019, 06/22/2019   Pneumococcal Conjugate-13 07/09/2016   Pneumococcal Polysaccharide-23 05/23/2018   Tdap 12/02/2018, 03/15/2022   Zoster Recombinat (Shingrix) 12/02/2018, 03/09/2019   Zoster, Live 12/13/2015    These are the patient goals that we discussed:  Goals Addressed               This Visit's Progress     Patient Stated (pt-stated)        Patient stated that he would like to loose 10 lbs.         This is a list of Health Maintenance Items that are overdue or due now: There are no preventive care reminders to display for this patient.   Orders/Referrals Placed Today: No orders of the defined types were placed in this encounter.  (Contact our referral department at [phone] if you have not spoken with someone about your referral appointment within the next 5 days)    Follow-up Plan Follow-up with Earl Amor, DO as  planned Medicare wellness visit in one year.  Patient will access AVS on my chart.     Health Maintenance, Male Adopting a healthy lifestyle and getting preventive care are important in promoting health and wellness. Ask your health care provider about: The right schedule for you to have regular tests and exams. Things you can do on your own to prevent diseases and keep yourself healthy. What should I know about diet, weight, and exercise? Eat a healthy diet  Eat a diet that includes plenty of vegetables, fruits, low-fat dairy products, and lean protein. Do not eat a lot of foods that are high in solid fats, added sugars, or sodium. Maintain a healthy weight Body mass index (BMI) is a measurement that can be used to identify possible weight problems. It estimates body fat based on height and weight. Your health care provider can help determine your BMI and help you achieve or maintain a healthy weight. Get regular exercise Get regular exercise. This is one of the most important things you can do for your health. Most adults should: Exercise for at least 150 minutes each week. The exercise should increase your heart rate and make you sweat (moderate-intensity exercise). Do strengthening exercises at least twice a week. This is in addition to the moderate-intensity exercise. Spend less time sitting. Even light physical activity can be beneficial. Watch cholesterol and blood lipids Have your blood tested for lipids and cholesterol at 76 years of age, then have this test every 5 years. You may need to  have your cholesterol levels checked more often if: Your lipid or cholesterol levels are high. You are older than 76 years of age. You are at high risk for heart disease. What should I know about cancer screening? Many types of cancers can be detected early and may often be prevented. Depending on your health history and family history, you may need to have cancer screening at various ages.  This may include screening for: Colorectal cancer. Prostate cancer. Skin cancer. Lung cancer. What should I know about heart disease, diabetes, and high blood pressure? Blood pressure and heart disease High blood pressure causes heart disease and increases the risk of stroke. This is more likely to develop in people who have high blood pressure readings or are overweight. Talk with your health care provider about your target blood pressure readings. Have your blood pressure checked: Every 3-5 years if you are 79-7 years of age. Every year if you are 69 years old or older. If you are between the ages of 16 and 49 and are a current or former smoker, ask your health care provider if you should have a one-time screening for abdominal aortic aneurysm (AAA). Diabetes Have regular diabetes screenings. This checks your fasting blood sugar level. Have the screening done: Once every three years after age 46 if you are at a normal weight and have a low risk for diabetes. More often and at a younger age if you are overweight or have a high risk for diabetes. What should I know about preventing infection? Hepatitis B If you have a higher risk for hepatitis B, you should be screened for this virus. Talk with your health care provider to find out if you are at risk for hepatitis B infection. Hepatitis C Blood testing is recommended for: Everyone born from [birth year] through 1964. Anyone with known risk factors for hepatitis C. Sexually transmitted infections (STIs) You should be screened each year for STIs, including gonorrhea and chlamydia, if: You are sexually active and are younger than 76 years of age. You are older than 76 years of age and your health care provider tells you that you are at risk for this type of infection. Your sexual activity has changed since you were last screened, and you are at increased risk for chlamydia or gonorrhea. Ask your health care provider if you are at risk. Ask your  health care provider about whether you are at high risk for HIV. Your health care provider may recommend a prescription medicine to help prevent HIV infection. If you choose to take medicine to prevent HIV, you should first get tested for HIV. You should then be tested every 3 months for as long as you are taking the medicine. Follow these instructions at home: Alcohol use Do not drink alcohol if your health care provider tells you not to drink. If you drink alcohol: Limit how much you have to 0-2 drinks a day. Know how much alcohol is in your drink. In the [place] one drink equals one 12 oz bottle of beer (355 mL), one 5 oz glass of wine (148 mL), or one 1 oz glass of hard liquor (44 mL). Lifestyle Do not use any products that contain nicotine or tobacco. These products include cigarettes, chewing tobacco, and vaping devices, such as e-cigarettes. If you need help quitting, ask your health care provider. Do not use street drugs. Do not share needles. Ask your health care provider for help if you need support or information about quitting drugs. General instructions  Schedule regular health, dental, and eye exams. Stay current with your vaccines. Tell your health care provider if: You often feel depressed. You have ever been abused or do not feel safe at home. Summary Adopting a healthy lifestyle and getting preventive care are important in promoting health and wellness. Follow your health care provider's instructions about healthy diet, exercising, and getting tested or screened for diseases. Follow your health care provider's instructions on monitoring your cholesterol and blood pressure. This information is not intended to replace advice given to you by your health care provider. Make sure you discuss any questions you have with your health care provider. Document Revised: 08/22/2020 Document Reviewed: 08/22/2020 Elsevier Patient Education  2024 [employer].

## 2022-09-24 ENCOUNTER — Ambulatory Visit: Payer: Medicare Other

## 2022-10-22 ENCOUNTER — Telehealth: Payer: Self-pay | Admitting: General Practice

## 2022-10-22 NOTE — Transitions of Care (Post Inpatient/ED Visit) (Signed)
   10/22/2022  Name: Earl [name] MRN: [id number] DOB: 01-Mar-1946  Today's TOC FU Call Status: Today's TOC FU Call Status:: Successful TOC FU Call Competed TOC FU Call Complete Date: 10/22/22  Transition Care Management Follow-up Telephone Call Date of Discharge: 10/21/22 Discharge Facility: Other ([facility]) Name of Other ([facility]) Discharge Facility: [facility] Type of Discharge: Emergency Department Reason for ED Visit: Other: (appendicitis) How have you been since you were released from the hospital?: Better Any questions or concerns?: No  Items Reviewed: Did you receive and understand the discharge instructions provided?: Yes Medications obtained,verified, and reconciled?: Yes (Medications Reviewed) Any new allergies since your discharge?: No Dietary orders reviewed?: NA Do you have support at home?: Yes  Medications Reviewed Today: Medications Reviewed Today     Reviewed by Modesto Charon, RN (Registered Nurse) on 10/22/22 at 1051  Med List Status: <None>   Medication Order Taking? Sig Documenting Provider Last Dose Status Informant  amLODipine (NORVASC) 10 MG tablet [id number]  Take 10 mg by mouth daily. [provider]  Active   aspirin 81 MG chewable tablet [id number]  Chew by mouth. [provider]  Active   hydrochlorothiazide (HYDRODIURIL) 25 MG tablet [id number]  Take 25 mg by mouth daily. [provider]  Active   HYDROcodone-acetaminophen (NORCO/VICODIN) 5-325 MG tablet [id number] Yes Take by mouth. [provider]  Active   ibuprofen (ADVIL) 600 MG tablet [id number] Yes Take by mouth. [provider]  Active   levothyroxine (SYNTHROID) 88 MCG tablet [id number]  TAKE 1 TABLET BY MOUTH EVERY DAY Wachs, Erika S, DO  Active   lisinopril (ZESTRIL) 40 MG tablet [id number]  Take 1 tablet by mouth daily. [provider]  Active   RESTASIS 0.05 % ophthalmic emulsion [id number]  Place 1 drop into  both eyes 2 (two) times daily. [provider]  Active   rosuvastatin (CRESTOR) 40 MG tablet [id number]  Take 1 tablet (40 mg total) by mouth daily. Lewayne Bunting, MD  Active             Home Care and Equipment/Supplies: Were Home Health Services Ordered?: NA Any new equipment or medical supplies ordered?: NA  Functional Questionnaire: Do you need assistance with bathing/showering or dressing?: No Do you need assistance with meal preparation?: No Do you need assistance with eating?: No Do you have difficulty maintaining continence: No Do you need assistance with getting out of bed/getting out of a chair/moving?: No Do you have difficulty managing or taking your medications?: No  Follow up appointments reviewed: PCP Follow-up appointment confirmed?: Yes Date of PCP follow-up appointment?: 10/25/22 Follow-up Provider: Dr. Tamera Punt Specialist [facility] Follow-up appointment confirmed?: No (He was told to follow up with PCP.) Do you need transportation to your follow-up appointment?: No Do you understand care options if your condition(s) worsen?: Yes-patient verbalized understanding  SDOH Interventions Today    Flowsheet Row Most Recent Value  SDOH Interventions   Transportation Interventions Intervention Not Indicated       SIGNATURE Modesto Charon, RN BSN Nurse Health Advisor

## 2022-10-25 ENCOUNTER — Ambulatory Visit (INDEPENDENT_AMBULATORY_CARE_PROVIDER_SITE_OTHER): Payer: Medicare Other | Admitting: Family Medicine

## 2022-10-25 ENCOUNTER — Encounter: Payer: Self-pay | Admitting: Family Medicine

## 2022-10-25 VITALS — BP 144/79 | HR 73 | Resp 18 | Ht 72.0 in | Wt 218.5 lb

## 2022-10-25 DIAGNOSIS — R21 Rash and other nonspecific skin eruption: Secondary | ICD-10-CM

## 2022-10-25 DIAGNOSIS — Z09 Encounter for follow-up examination after completed treatment for conditions other than malignant neoplasm: Secondary | ICD-10-CM | POA: Diagnosis not present

## 2022-10-25 DIAGNOSIS — N1831 Chronic kidney disease, stage 3a: Secondary | ICD-10-CM | POA: Diagnosis not present

## 2022-10-25 DIAGNOSIS — F439 Reaction to severe stress, unspecified: Secondary | ICD-10-CM

## 2022-10-25 MED ORDER — TRIAMCINOLONE ACETONIDE 0.5 % EX CREA: 1.0000 | TOPICAL_CREAM | Freq: Two times a day (BID) | CUTANEOUS | 3 refills | Status: DC

## 2022-10-25 NOTE — Assessment & Plan Note (Signed)
Patient presents for follow-up secondary to appendectomy last week.  He is doing much better and is able to move around.  Incision sites were examined today with no erythema or purulent discharge.  Glue is intact over trochanteric sites.

## 2022-10-25 NOTE — Assessment & Plan Note (Signed)
Patient continues to undergo an extreme amount of stress due to this issue with the lawyers and this divorce.  He is very overwhelmed.  I do believe this is contributing to his health.  He has been given instructions by cardiology and myself to not go back into court until he is medically cleared due to this extreme amount of stress.  He also has a history of PTSD that is playing a role in his stress

## 2022-10-25 NOTE — Assessment & Plan Note (Signed)
Patient had elevated creatinine in the hospital secondary to IV contrast.  Will go ahead and repeat BMP today to make sure kidney function has returned back to normal

## 2022-10-25 NOTE — Assessment & Plan Note (Signed)
Patient says that he has a rash on his back that his wife noticed last night.  No rashes apparent on exam today I do see an increase in vasculature of his back but there are no hives.  I did go ahead and give patient triamcinolone cream to use if rash outbreaks at nighttime it could be some detergent and counseled patient on contact dermatitis.

## 2022-10-25 NOTE — Progress Notes (Signed)
Established patient visit   Patient: Earl [name]   DOB: [birth year]   77 y.o. Male  MRN: [id number] Visit Date: 10/25/2022  Today's healthcare provider: Charlton Amor, DO   No chief complaint on file.   SUBJECTIVE   No chief complaint on file.  HPI  Earl [name] presents today for hospital follow-up.  He was admitted about a week ago for an appendectomy.  Review of Systems  Constitutional:  Negative for activity change, fatigue and fever.  Respiratory:  Negative for cough and shortness of breath.   Cardiovascular:  Negative for chest pain.  Gastrointestinal:  Negative for abdominal pain.  Genitourinary:  Negative for difficulty urinating.       Current Meds  Medication Sig   triamcinolone cream (KENALOG) 0.5 % Apply 1 Application topically 2 (two) times daily. To affected areas.    OBJECTIVE    There were no vitals taken for this visit.  Physical Exam Vitals and nursing note reviewed.  Constitutional:      General: He is not in acute distress.    Appearance: Normal appearance.  HENT:     Head: Normocephalic and atraumatic.     Right Ear: External ear normal.     Left Ear: External ear normal.     Nose: Nose normal.  Eyes:     Conjunctiva/sclera: Conjunctivae normal.  Cardiovascular:     Rate and Rhythm: Normal rate and regular rhythm.  Pulmonary:     Effort: Pulmonary effort is normal.     Breath sounds: Normal breath sounds.  Neurological:     General: No focal deficit present.     Mental Status: He is alert and oriented to person, place, and time.  Psychiatric:        Mood and Affect: Mood normal.        Behavior: Behavior normal.        Thought Content: Thought content normal.        Judgment: Judgment normal.        ASSESSMENT & PLAN    Problem List Items Addressed This Visit       Musculoskeletal and Integument   Rash    Patient says that he has a rash on his back that his wife noticed last night.  No rashes apparent on exam today I do see an  increase in vasculature of his back but there are no hives.  I did go ahead and give patient triamcinolone cream to use if rash outbreaks at nighttime it could be some detergent and counseled patient on contact dermatitis.        Genitourinary   CKD stage 3a, GFR 45-59 ml/min (HCC) - Primary    Patient had elevated creatinine in the hospital secondary to IV contrast.  Will go ahead and repeat BMP today to make sure kidney function has returned back to normal      Relevant Orders   BASIC METABOLIC PANEL WITH GFR     Other   Stress    Patient continues to undergo an extreme amount of stress due to this issue with the lawyers and this divorce.  He is very overwhelmed.  I do believe this is contributing to his health.  He has been given instructions by cardiology and myself to not go back into court until he is medically cleared due to this extreme amount of stress.  He also has a history of PTSD that is playing a role in his stress  Hospital discharge follow-up    Patient presents for follow-up secondary to appendectomy last week.  He is doing much better and is able to move around.  Incision sites were examined today with no erythema or purulent discharge.  Glue is intact over trochanteric sites.       Return if symptoms worsen or fail to improve.      Meds ordered this encounter  Medications   triamcinolone cream (KENALOG) 0.5 %    Sig: Apply 1 Application topically 2 (two) times daily. To affected areas.    Dispense:  30 g    Refill:  3    Orders Placed This Encounter  Procedures   BASIC METABOLIC PANEL WITH GFR     Charlton Amor, DO  [facility] Medicine at [facility] [phone] (phone) [phone] (fax)  [facility]

## 2022-10-26 ENCOUNTER — Other Ambulatory Visit: Payer: Self-pay | Admitting: Family Medicine

## 2022-10-26 LAB — BASIC METABOLIC PANEL WITH GFR
BUN/Creatinine Ratio: 13 (calc) (ref 6–22)
BUN: 20 mg/dL (ref 7–25)
CO2: 29 mmol/L (ref 20–32)
Calcium: 9.7 mg/dL (ref 8.6–10.3)
Chloride: 100 mmol/L (ref 98–110)
Creat: 1.49 mg/dL — ABNORMAL HIGH (ref 0.70–1.28)
Glucose, Bld: 116 mg/dL — ABNORMAL HIGH (ref 65–99)
Potassium: 4.5 mmol/L (ref 3.5–5.3)
Sodium: 138 mmol/L (ref 135–146)
eGFR: 49 mL/min/{1.73_m2} — ABNORMAL LOW (ref >=60)

## 2022-11-06 ENCOUNTER — Telehealth: Payer: Self-pay | Admitting: *Deleted

## 2022-11-06 DIAGNOSIS — E7800 Pure hypercholesterolemia, unspecified: Secondary | ICD-10-CM

## 2022-11-06 LAB — LIPID PANEL
Chol/HDL Ratio: 4.2 ratio (ref 0.0–5.0)
Cholesterol, Total: 135 mg/dL (ref 100–199)
HDL: 32 mg/dL — ABNORMAL LOW (ref >39)
LDL Chol Calc (NIH): 77 mg/dL (ref 0–99)
Triglycerides: 147 mg/dL (ref 0–149)
VLDL Cholesterol Cal: 26 mg/dL (ref 5–40)

## 2022-11-06 LAB — HEPATIC FUNCTION PANEL
ALT: 21 IU/L (ref 0–44)
AST: 25 IU/L (ref 0–40)
Albumin: 4.6 g/dL (ref 3.8–4.8)
Alkaline Phosphatase: 72 IU/L (ref 44–121)
Bilirubin Total: 0.8 mg/dL (ref 0.0–1.2)
Bilirubin, Direct: 0.22 mg/dL (ref 0.00–0.40)
Total Protein: 7 g/dL (ref 6.0–8.5)

## 2022-11-06 MED ORDER — EZETIMIBE 10 MG PO TABS
10.0000 mg | ORAL_TABLET | Freq: Every day | ORAL | 3 refills | Status: DC
Start: 2022-11-06 — End: 2023-10-16

## 2022-11-06 NOTE — Telephone Encounter (Signed)
-----   Message from Olga Millers sent at 11/06/2022  8:00 AM EDT ----- LDL not at goal.  Add Zetia 10 mg daily.  Check lipids and liver in 8 weeks. Olga Millers

## 2022-11-06 NOTE — Telephone Encounter (Signed)
Pt has reviewed results via my chart  New script sent to the pharmacy  Lab orders mailed to the pt  

## 2022-11-28 ENCOUNTER — Other Ambulatory Visit: Payer: Self-pay | Admitting: Family Medicine

## 2022-11-29 MED ORDER — AMLODIPINE BESYLATE 10 MG PO TABS: 10.0000 mg | ORAL_TABLET | Freq: Every day | ORAL | 1 refills | Status: DC

## 2023-01-07 LAB — HEPATIC FUNCTION PANEL
ALT: 33 IU/L (ref 0–44)
AST: 33 IU/L (ref 0–40)
Albumin: 4.7 g/dL (ref 3.8–4.8)
Alkaline Phosphatase: 67 IU/L (ref 44–121)
Bilirubin Total: 0.8 mg/dL (ref 0.0–1.2)
Bilirubin, Direct: 0.25 mg/dL (ref 0.00–0.40)
Total Protein: 7.1 g/dL (ref 6.0–8.5)

## 2023-01-07 LAB — LIPID PANEL
Chol/HDL Ratio: 3.6 ratio (ref 0.0–5.0)
Cholesterol, Total: 113 mg/dL (ref 100–199)
HDL: 31 mg/dL — ABNORMAL LOW (ref >39)
LDL Chol Calc (NIH): 58 mg/dL (ref 0–99)
Triglycerides: 138 mg/dL (ref 0–149)
VLDL Cholesterol Cal: 24 mg/dL (ref 5–40)

## 2023-01-13 ENCOUNTER — Other Ambulatory Visit: Payer: Self-pay | Admitting: Family Medicine

## 2023-01-13 DIAGNOSIS — E034 Atrophy of thyroid (acquired): Secondary | ICD-10-CM

## 2023-01-18 ENCOUNTER — Encounter: Payer: Self-pay | Admitting: Family Medicine

## 2023-01-18 ENCOUNTER — Ambulatory Visit (INDEPENDENT_AMBULATORY_CARE_PROVIDER_SITE_OTHER): Payer: Medicare Other | Admitting: Family Medicine

## 2023-01-18 VITALS — BP 121/71 | HR 72 | Ht 72.0 in | Wt 216.0 lb

## 2023-01-18 DIAGNOSIS — Z23 Encounter for immunization: Secondary | ICD-10-CM

## 2023-01-18 DIAGNOSIS — R7303 Prediabetes: Secondary | ICD-10-CM

## 2023-01-18 DIAGNOSIS — N1831 Chronic kidney disease, stage 3a: Secondary | ICD-10-CM

## 2023-01-18 DIAGNOSIS — E039 Hypothyroidism, unspecified: Secondary | ICD-10-CM | POA: Diagnosis not present

## 2023-01-18 DIAGNOSIS — I10 Essential (primary) hypertension: Secondary | ICD-10-CM

## 2023-01-18 NOTE — Assessment & Plan Note (Signed)
-   will get A1c. Pt has lost some weight since last visit and is eating healthy and walking daily so I do anticipate it being improved.

## 2023-01-18 NOTE — Progress Notes (Signed)
Established patient visit   Patient: Earl [name]   DOB: 18-Jul-1945   77 y.o. Male  MRN: [id number] Visit Date: 01/18/2023  Today's healthcare provider: Charlton Amor, DO   Chief Complaint  Patient presents with   Medical Management of Chronic Issues    Repeat labs     SUBJECTIVE    Chief Complaint  Patient presents with   Medical Management of Chronic Issues    Repeat labs    HPI HPI     Medical Management of Chronic Issues    Additional comments: Repeat labs       Last edited by Roselyn Reef, CMA on 01/18/2023  8:19 AM.      Pt presents for HTN and prediabetes follow up.   HTN - well controlled  - amlodipine 10mg   - hydrochlorothiazide 25mg   - lisinopril 40mg    HLD -on zetia 10mg    Hypothyroidism - synthroid   Review of Systems  Constitutional:  Negative for activity change, fatigue and fever.  Respiratory:  Negative for cough and shortness of breath.   Cardiovascular:  Negative for chest pain.  Gastrointestinal:  Negative for abdominal pain.  Genitourinary:  Negative for difficulty urinating.       Current Meds  Medication Sig   amLODipine (NORVASC) 10 MG tablet Take 1 tablet (10 mg total) by mouth daily.   aspirin 81 MG chewable tablet Chew by mouth.   ezetimibe (ZETIA) 10 MG tablet Take 1 tablet (10 mg total) by mouth daily.   hydrochlorothiazide (HYDRODIURIL) 25 MG tablet Take 25 mg by mouth daily.   HYDROcodone-acetaminophen (NORCO/VICODIN) 5-325 MG tablet Take by mouth.   levothyroxine (SYNTHROID) 88 MCG tablet TAKE 1 TABLET BY MOUTH EVERY DAY   lisinopril (ZESTRIL) 40 MG tablet Take 1 tablet by mouth daily.   RESTASIS 0.05 % ophthalmic emulsion Place 1 drop into both eyes 2 (two) times daily.   triamcinolone cream (KENALOG) 0.5 % Apply 1 Application topically 2 (two) times daily. To affected areas.    OBJECTIVE    BP 121/71 (BP Location: Left Arm, Patient Position: Sitting, Cuff Size: Large)   Pulse 72   Ht 6' (1.829 m)    Wt 216 lb (98 kg)   SpO2 99%   BMI 29.29 kg/m   Physical Exam Vitals and nursing note reviewed.  Constitutional:      General: He is not in acute distress.    Appearance: Normal appearance.  HENT:     Head: Normocephalic and atraumatic.     Right Ear: External ear normal.     Left Ear: External ear normal.     Nose: Nose normal.  Eyes:     Conjunctiva/sclera: Conjunctivae normal.  Cardiovascular:     Rate and Rhythm: Normal rate.  Pulmonary:     Effort: Pulmonary effort is normal.  Neurological:     General: No focal deficit present.     Mental Status: He is alert and oriented to person, place, and time.  Psychiatric:        Mood and Affect: Mood normal.        Behavior: Behavior normal.        Thought Content: Thought content normal.        Judgment: Judgment normal.        ASSESSMENT & PLAN    Problem List Items Addressed This Visit       Cardiovascular and Mediastinum   Primary hypertension - Primary    - well  controlled, doing well continue meds        Relevant Orders   Basic Metabolic Panel (BMET)     Endocrine   Acquired hypothyroidism    - doing well no issues, continue current regimen        Genitourinary   CKD stage 3a, GFR 45-59 ml/min (HCC)    BMP to assess kidney function      Relevant Orders   Basic Metabolic Panel (BMET)     Other   Prediabetes    - will get A1c. Pt has lost some weight since last visit and is eating healthy and walking daily so I do anticipate it being improved.      Relevant Orders   HgB A1c    Return in about 7 months (around 08/18/2023).      No orders of the defined types were placed in this encounter.   Orders Placed This Encounter  Procedures   Basic Metabolic Panel (BMET)   HgB A1c     Charlton Amor, DO  [facility] Primary Care & Sports Medicine at [facility] [phone] (phone) [phone] (fax)  [facility]

## 2023-01-18 NOTE — Assessment & Plan Note (Signed)
-   doing well no issues, continue current regimen

## 2023-01-18 NOTE — Assessment & Plan Note (Signed)
BMP to assess kidney function

## 2023-01-18 NOTE — Assessment & Plan Note (Signed)
-   well controlled, doing well continue meds

## 2023-01-19 LAB — HEMOGLOBIN A1C
Est. average glucose Bld gHb Est-mCnc: 123 mg/dL
Hgb A1c MFr Bld: 5.9 % — ABNORMAL HIGH (ref 4.8–5.6)

## 2023-01-19 LAB — BASIC METABOLIC PANEL
BUN/Creatinine Ratio: 12 (ref 10–24)
BUN: 16 mg/dL (ref 8–27)
CO2: 24 mmol/L (ref 20–29)
Calcium: 9.9 mg/dL (ref 8.6–10.2)
Chloride: 100 mmol/L (ref 96–106)
Creatinine, Ser: 1.35 mg/dL — ABNORMAL HIGH (ref 0.76–1.27)
Glucose: 109 mg/dL — ABNORMAL HIGH (ref 70–99)
Potassium: 4.2 mmol/L (ref 3.5–5.2)
Sodium: 140 mmol/L (ref 134–144)
eGFR: 54 mL/min/{1.73_m2} — ABNORMAL LOW (ref >59)

## 2023-04-24 ENCOUNTER — Other Ambulatory Visit: Payer: Self-pay | Admitting: Family Medicine

## 2023-04-24 DIAGNOSIS — I10 Essential (primary) hypertension: Secondary | ICD-10-CM

## 2023-05-05 ENCOUNTER — Other Ambulatory Visit: Payer: Self-pay | Admitting: Family Medicine

## 2023-05-05 DIAGNOSIS — I10 Essential (primary) hypertension: Secondary | ICD-10-CM

## 2023-05-23 ENCOUNTER — Other Ambulatory Visit: Payer: Self-pay | Admitting: Family Medicine

## 2023-05-25 ENCOUNTER — Other Ambulatory Visit: Payer: Self-pay | Admitting: Family Medicine

## 2023-05-25 DIAGNOSIS — E034 Atrophy of thyroid (acquired): Secondary | ICD-10-CM

## 2023-08-19 ENCOUNTER — Other Ambulatory Visit: Payer: Self-pay | Admitting: Cardiology

## 2023-08-19 DIAGNOSIS — E7800 Pure hypercholesterolemia, unspecified: Secondary | ICD-10-CM

## 2023-08-22 ENCOUNTER — Encounter: Payer: Self-pay | Admitting: Family Medicine

## 2023-09-03 ENCOUNTER — Ambulatory Visit: Admitting: Family Medicine

## 2023-09-10 ENCOUNTER — Ambulatory Visit (INDEPENDENT_AMBULATORY_CARE_PROVIDER_SITE_OTHER): Admitting: Family Medicine

## 2023-09-10 ENCOUNTER — Ambulatory Visit: Payer: Medicare Other | Admitting: Family Medicine

## 2023-09-10 ENCOUNTER — Encounter: Payer: Self-pay | Admitting: Family Medicine

## 2023-09-10 VITALS — BP 126/62 | HR 71 | Ht 72.0 in | Wt 221.1 lb

## 2023-09-10 DIAGNOSIS — I10 Essential (primary) hypertension: Secondary | ICD-10-CM | POA: Diagnosis not present

## 2023-09-10 DIAGNOSIS — E039 Hypothyroidism, unspecified: Secondary | ICD-10-CM | POA: Diagnosis not present

## 2023-09-10 DIAGNOSIS — E7800 Pure hypercholesterolemia, unspecified: Secondary | ICD-10-CM

## 2023-09-10 DIAGNOSIS — E785 Hyperlipidemia, unspecified: Secondary | ICD-10-CM | POA: Insufficient documentation

## 2023-09-10 DIAGNOSIS — R7303 Prediabetes: Secondary | ICD-10-CM | POA: Diagnosis not present

## 2023-09-10 DIAGNOSIS — N1831 Chronic kidney disease, stage 3a: Secondary | ICD-10-CM

## 2023-09-10 DIAGNOSIS — F4310 Post-traumatic stress disorder, unspecified: Secondary | ICD-10-CM

## 2023-09-10 DIAGNOSIS — Z8679 Personal history of other diseases of the circulatory system: Secondary | ICD-10-CM

## 2023-09-10 LAB — POCT GLYCOSYLATED HEMOGLOBIN (HGB A1C): Hemoglobin A1C: 5.5 % (ref 4.0–5.6)

## 2023-09-10 MED ORDER — ROSUVASTATIN CALCIUM 40 MG PO TABS
40.0000 mg | ORAL_TABLET | Freq: Every day | ORAL | 3 refills | Status: AC
Start: 2023-09-10 — End: 2024-01-13

## 2023-09-10 NOTE — Assessment & Plan Note (Signed)
 Pressure looks fantastic today.  Continue current regimen.

## 2023-09-10 NOTE — Assessment & Plan Note (Signed)
 Discussed his diagnosis of CKD 3.  Recommend having renal function checked every 6 months.  And urine microalbumin at least yearly to make sure there is no extra stress on the kidneys and avoiding things like Aleve and ibuprofen and keeping that to an absolute minimum.  If otherwise doing well follow-up in 6 months.

## 2023-09-10 NOTE — Assessment & Plan Note (Signed)
 Currently on 88 mcg.  Will due to recheck TSH today.

## 2023-09-10 NOTE — Assessment & Plan Note (Signed)
 Ran out of his statin a couple of weeks ago but is still taking the Zetia .  Refills sent to pharmacy we are checking labs today but suspect LDL not be at goal.

## 2023-09-10 NOTE — Assessment & Plan Note (Signed)
 1C looks phenomenal today he has done a great job I think cutting out soda has made a big difference.

## 2023-09-10 NOTE — Progress Notes (Signed)
 Established Patient Office Visit  Subjective  Patient ID: TUKKER BYRNS, male    DOB: [birth year]  Age: 78 y.o. MRN: [id number]  Chief Complaint  Patient presents with   Diabetes   Hypertension    HPI  Laurie Poplar is here for 75-month follow-up.  He is doing well okay.  In regards to his diabetes he has really done a great job in giving up soda.  He has history of aortic aneurysm repairs and follows regularly for that.  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    ROS    Objective:      BP 126/62   Pulse 71   Ht 6' (1.829 m)   Wt 221 lb 1.6 oz (100.3 kg)   SpO2 100%   BMI 29.99 kg/m    Physical Exam Vitals and nursing note reviewed.  Constitutional:      Appearance: Normal appearance.  HENT:     Head: Normocephalic and atraumatic.  Eyes:     Conjunctiva/sclera: Conjunctivae normal.  Neck:     Vascular: No carotid bruit.  Cardiovascular:     Rate and Rhythm: Normal rate and regular rhythm.  Pulmonary:     Effort: Pulmonary effort is normal.     Breath sounds: Normal breath sounds.  Musculoskeletal:     Cervical back: Neck supple.  Skin:    General: Skin is warm and dry.  Neurological:     Mental Status: He is alert.  Psychiatric:        Mood and Affect: Mood normal.      Results for orders placed or performed in visit on 09/10/23  CBC  Result Value Ref Range   WBC 8.1 3.4 - 10.8 x10E3/uL   RBC 5.33 4.14 - 5.80 x10E6/uL   Hemoglobin 16.1 13.0 - 17.7 g/dL   Hematocrit 24.4 01.0 - 51.0 %   MCV 92 79 - 97 fL   MCH 30.2 26.6 - 33.0 pg   MCHC 33.0 31.5 - 35.7 g/dL   RDW 27.2 53.6 - 64.4 %   Platelets 224 150 - 450 x10E3/uL  Urine Microalbumin w/creat. ratio  Result Value Ref Range   Creatinine, Urine 35.1 Not Estab. mg/dL   Microalbumin, Urine 03.4 Not Estab. ug/mL   Microalb/Creat Ratio 44 (H) 0 - 29 mg/g creat  Phosphorus  Result Value Ref Range   Phosphorus 2.9 2.8 - 4.1 mg/dL  POCT HgB V4Q  Result Value Ref Range   Hemoglobin A1C 5.5 4.0  - 5.6 %   HbA1c POC (<> result, manual entry)     HbA1c, POC (prediabetic range)     HbA1c, POC (controlled diabetic range)        The ASCVD Risk score (Arnett DK, et al., 2019) failed to calculate for the following reasons:   The valid total cholesterol range is 130 to 320 mg/dL    Assessment & Plan:   Problem List Items Addressed This Visit       Cardiovascular and Mediastinum   Primary hypertension   Pressure looks fantastic today.  Continue current regimen.      Relevant Medications   rosuvastatin  (CRESTOR ) 40 MG tablet   Other Relevant Orders   CBC (Completed)   Comprehensive metabolic panel with GFR   Lipid panel   Urine Microalbumin w/creat. ratio (Completed)   Phosphorus (Completed)     Endocrine   Acquired hypothyroidism   Currently on 88 mcg.  Will due to recheck TSH today.  Relevant Orders   CBC (Completed)   Comprehensive metabolic panel with GFR   Lipid panel   Urine Microalbumin w/creat. ratio (Completed)   Phosphorus (Completed)     Genitourinary   CKD stage 3a, GFR 45-59 ml/min (HCC)   Discussed his diagnosis of CKD 3.  Recommend having renal function checked every 6 months.  And urine microalbumin at least yearly to make sure there is no extra stress on the kidneys and avoiding things like Aleve and ibuprofen and keeping that to an absolute minimum.  If otherwise doing well follow-up in 6 months.      Relevant Orders   CBC (Completed)   Comprehensive metabolic panel with GFR   Lipid panel   Urine Microalbumin w/creat. ratio (Completed)   Phosphorus (Completed)     Other   PTSD (post-traumatic stress disorder)   Not currently engaged in any therapy or counseling.  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      Prediabetes - Primary   1C looks phenomenal today he has done a great job I think cutting out soda has made a big difference.      Relevant Orders   POCT HgB A1C (Completed)   CBC (Completed)   Comprehensive metabolic  panel with GFR   Lipid panel   Urine Microalbumin w/creat. ratio (Completed)   Phosphorus (Completed)   Hyperlipidemia, GOAL < 70   Ran out of his statin a couple of weeks ago but is still taking the Zetia .  Refills sent to pharmacy we are checking labs today but suspect LDL not be at goal.      Relevant Medications   rosuvastatin  (CRESTOR ) 40 MG tablet   History of aortic aneurysm   Follows with vascular surgery at [facility] regularly.  Blood pressure well-controlled.  On a statin and Zetia .  Also takes a daily aspirin.       Return in about 4 months (around 01/11/2024) for Hypertension.    Duaine German, MD

## 2023-09-11 ENCOUNTER — Ambulatory Visit: Payer: Self-pay | Admitting: Family Medicine

## 2023-09-11 LAB — CBC
Hematocrit: 48.8 % (ref 37.5–51.0)
Hemoglobin: 16.1 g/dL (ref 13.0–17.7)
MCH: 30.2 pg (ref 26.6–33.0)
MCHC: 33 g/dL (ref 31.5–35.7)
MCV: 92 fL (ref 79–97)
Platelets: 224 10*3/uL (ref 150–450)
RBC: 5.33 x10E6/uL (ref 4.14–5.80)
RDW: 13.7 % (ref 11.6–15.4)
WBC: 8.1 10*3/uL (ref 3.4–10.8)

## 2023-09-11 LAB — MICROALBUMIN / CREATININE URINE RATIO
Creatinine, Urine: 35.1 mg/dL
Microalb/Creat Ratio: 44 mg/g{creat} — ABNORMAL HIGH (ref 0–29)
Microalbumin, Urine: 15.5 ug/mL

## 2023-09-11 LAB — PHOSPHORUS: Phosphorus: 2.9 mg/dL (ref 2.8–4.1)

## 2023-09-11 NOTE — Progress Notes (Signed)
 HI Earl [name],  It was a pleasure to meet you the other day.  The urine sample did show just a little bit of excess protein less than 30 is considered normal so I do want a keep an eye on that plan to recheck again in 6 months.  Your blood count looks great no sign of anemia or infection and phosphorus also looks normal.  Metabolic panel is still pending.  Can we check the lab and see if they are running the CMP and lipid panel.  For some reason it looks like it separate then the additional tests and am not sure why.  It says needs to be collected.

## 2023-09-12 DIAGNOSIS — F4310 Post-traumatic stress disorder, unspecified: Secondary | ICD-10-CM | POA: Insufficient documentation

## 2023-09-12 NOTE — Assessment & Plan Note (Signed)
 Not currently engaged in any therapy or counseling.  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

## 2023-09-12 NOTE — Assessment & Plan Note (Addendum)
 Follows with vascular surgery at [facility] regularly.  Blood pressure well-controlled.  On a statin and Zetia .  Also takes a daily aspirin.

## 2023-09-18 LAB — SPECIMEN STATUS REPORT

## 2023-09-19 ENCOUNTER — Telehealth: Payer: Self-pay

## 2023-09-19 DIAGNOSIS — N1831 Chronic kidney disease, stage 3a: Secondary | ICD-10-CM

## 2023-09-19 DIAGNOSIS — R7303 Prediabetes: Secondary | ICD-10-CM

## 2023-09-19 DIAGNOSIS — I10 Essential (primary) hypertension: Secondary | ICD-10-CM

## 2023-09-19 DIAGNOSIS — E7800 Pure hypercholesterolemia, unspecified: Secondary | ICD-10-CM

## 2023-09-19 NOTE — Telephone Encounter (Signed)
 Spoke w/pt and advised him that when I attempted to add on the labs to his previous lab this couldn't be done and that if he wanted to wait and get this done at Dr. Lourdes Roy office he can or he can do it prior to his visit and he can let Dr. Audery Blazing know about this at his upcoming visit.   Pt stated that he would come by on Monday to have these done.

## 2023-09-19 NOTE — Telephone Encounter (Signed)
 Patient had a few questions about his blood work, (lipid panel) please give patient a call back as soon as possible, thanks.

## 2023-09-24 ENCOUNTER — Ambulatory Visit: Payer: Self-pay | Admitting: Family Medicine

## 2023-09-24 LAB — LIPID PANEL WITH LDL/HDL RATIO
Cholesterol, Total: 111 mg/dL (ref 100–199)
HDL: 31 mg/dL — ABNORMAL LOW (ref >39)
LDL Chol Calc (NIH): 57 mg/dL (ref 0–99)
LDL/HDL Ratio: 1.8 ratio (ref 0.0–3.6)
Triglycerides: 125 mg/dL (ref 0–149)
VLDL Cholesterol Cal: 23 mg/dL (ref 5–40)

## 2023-09-24 LAB — CMP14+EGFR
ALT: 26 IU/L (ref 0–44)
AST: 30 IU/L (ref 0–40)
Albumin: 4.6 g/dL (ref 3.8–4.8)
Alkaline Phosphatase: 71 IU/L (ref 44–121)
BUN/Creatinine Ratio: 12 (ref 10–24)
BUN: 17 mg/dL (ref 8–27)
Bilirubin Total: 0.7 mg/dL (ref 0.0–1.2)
CO2: 21 mmol/L (ref 20–29)
Calcium: 9.6 mg/dL (ref 8.6–10.2)
Chloride: 100 mmol/L (ref 96–106)
Creatinine, Ser: 1.46 mg/dL — ABNORMAL HIGH (ref 0.76–1.27)
Globulin, Total: 2.3 g/dL (ref 1.5–4.5)
Glucose: 108 mg/dL — ABNORMAL HIGH (ref 70–99)
Potassium: 4.2 mmol/L (ref 3.5–5.2)
Sodium: 139 mmol/L (ref 134–144)
Total Protein: 6.9 g/dL (ref 6.0–8.5)
eGFR: 50 mL/min/{1.73_m2} — ABNORMAL LOW (ref >59)

## 2023-09-24 NOTE — Progress Notes (Signed)
 Hi Grady, Kidney function is normal. Your LDL is at goal.

## 2023-10-08 NOTE — Progress Notes (Signed)
 HPI: FU palpitations. Patient has had previous abdominal aortic aneurysm repair. 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  Patient seen May 2024 with palpitations.  Coronary CTA May 2024 showed calcium  score 1970, minimal plaque in the left main, moderate stenosis in the proximal LAD, moderate stenosis in D1, proximal circumflex and distal right coronary artery.  Patient treated medically.  Since last seen patient denies dyspnea, chest pain, palpitations or syncope.  Current Outpatient Medications  Medication Sig Dispense Refill   amLODipine  (NORVASC ) 10 MG tablet TAKE 1 TABLET BY MOUTH EVERY DAY 90 tablet 1   aspirin 81 MG chewable tablet Chew by mouth.     ezetimibe  (ZETIA ) 10 MG tablet Take 1 tablet (10 mg total) by mouth daily. 90 tablet 3   hydrochlorothiazide (HYDRODIURIL) 25 MG tablet TAKE 1 TABLET BY MOUTH EVERY DAY 90 tablet 1   levothyroxine (SYNTHROID) 88 MCG tablet TAKE 1 TABLET BY MOUTH EVERY DAY 90 tablet 1   lisinopril (ZESTRIL) 40 MG tablet TAKE 1 TABLET BY MOUTH EVERY DAY 90 tablet 2   RESTASIS 0.05 % ophthalmic emulsion Place 1 drop into both eyes 2 (two) times daily.     rosuvastatin  (CRESTOR ) 40 MG tablet Take 1 tablet (40 mg total) by mouth daily. 100 tablet 3   No current facility-administered medications for this visit.     Past Medical History:  Diagnosis Date   Allergy    Niacin   Anxiety 09/03/2022   Court date for equitable distributions now in tenth year.   Aorta aneurysm (HCC)    Cataract 2022   Both eyes surgery 07/2021   Colon polyp    Heart murmur Years ago   None   Hyperlipidemia    Hypertension    Kidney disease    Thyroid disease During routine tests   Reaction to exposure to agent orange    Past Surgical History:  Procedure Laterality Date    ABDOMINAL AORTIC ANEURYSM REPAIR  2019   Dr. Georgina ([facility])   APPENDECTOMY  2023   CATARACT EXTRACTION     EYE SURGERY  April 2024   Cataract surgery/both eyes   VASECTOMY      Social History   Socioeconomic History   Marital status: Married    Spouse name: Nena   Number of children: 2   Years of education: 13   Highest education level: Associate degree: occupational, Scientist, product/process development, or vocational program  Occupational History   Occupation: Retired.  Tobacco Use   Smoking status: Former    Current packs/day: 0.00    Average packs/day: 1 pack/day for 15.0 years (15.0 ttl pk-yrs)    Types: Cigarettes    Start date: 2002    Quit date: 02/21/2015    Years since quitting: 8.6   Smokeless tobacco: Never  Vaping Use   Vaping status: Never Used  Substance and Sexual Activity   Alcohol use: Not Currently    Alcohol/week: 2.0 standard drinks of alcohol    Types: 2 Shots of liquor per week    Comment: One to two mixed drinks per month   Drug use: Never   Sexual activity: Yes    Birth control/protection: Other-see comments, None    Comment: Vasectomy  Other Topics Concern   Not on file  Social History Narrative   Lives with his wife. He has two children and one step child. He enjoys staying active in the community and church.  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    Social Drivers of Corporate investment banker Strain: Low Risk  (10/11/2023)   Overall Financial Resource Strain (CARDIA)    Difficulty of Paying Living Expenses: Not hard at all  Food Insecurity: No Food Insecurity (10/11/2023)   Hunger Vital Sign    Worried About Running Out of Food in the Last Year: Never true    Ran Out of Food in the Last Year: Never true  Transportation Needs: No Transportation Needs (10/11/2023)   PRAPARE - Administrator, Civil Service (Medical): No    Lack of Transportation (Non-Medical): No  Physical Activity: Insufficiently Active (10/11/2023)    Exercise Vital Sign    Days of Exercise per Week: 3 days    Minutes of Exercise per Session: 30 min  Stress: Stress Concern Present (10/11/2023)   [employer] of Occupational Health - Occupational Stress Questionnaire    Feeling of Stress: To some extent  Social Connections: Socially Integrated (10/11/2023)   Social Connection and Isolation Panel    Frequency of Communication with Friends and Family: More than three times a week    Frequency of Social Gatherings with Friends and Family: Once a week    Attends Religious Services: More than 4 times per year    Active Member of [employer] or Organizations: Yes    Attends Engineer, structural: More than 4 times per year    Marital Status: Married  Catering manager Violence: Not At Risk (09/18/2022)   Humiliation, Afraid, Rape, and Kick questionnaire    Fear of Current or Ex-Partner: No    Emotionally Abused: No    Physically Abused: No    Sexually Abused: No    Family History  Problem Relation Age of Onset   Diabetes Mother    Hypertension Mother    Atrial fibrillation Father    Kidney disease Father    Diabetes Other     ROS: no fevers or chills, productive cough, hemoptysis, dysphasia, odynophagia, melena, hematochezia, dysuria, hematuria, rash, seizure activity, orthopnea, PND, pedal edema, claudication. Remaining systems are negative.  Physical Exam: Well-developed well-nourished in no acute distress.  Skin is warm and dry.  HEENT is normal.  Neck is supple.  Chest is clear to auscultation with normal expansion.  Cardiovascular exam is regular rate and rhythm.  Abdominal exam nontender or distended. No masses palpated. Extremities show no edema. neuro grossly intact  EKG Interpretation Date/Time:  Monday October 14 2023 16:14:09 EDT Ventricular Rate:  84 PR Interval:  214 QRS Duration:  98 QT Interval:  386 QTC Calculation: 456 R Axis:   -44  Text Interpretation: Sinus rhythm with 1st degree A-V block with  Premature atrial complexes Left axis deviation Confirmed by Pietro Rogue ([id number]) on 10/14/2023 4:23:33 PM    A/P  1 coronary artery disease-patient denies chest pain.  Continue aspirin and statin.  2 history of abdominal aortic aneurysm repair/iliac aneurysms-followed by vascular surgery.  3 hypertension-patient's blood pressure is controlled.  Continue present medications.  4 hyperlipidemia-continue statin.  5 history of palpitations-no recent symptoms.  Rogue Pietro, MD

## 2023-10-14 ENCOUNTER — Ambulatory Visit (INDEPENDENT_AMBULATORY_CARE_PROVIDER_SITE_OTHER): Admitting: Cardiology

## 2023-10-14 ENCOUNTER — Encounter: Payer: Self-pay | Admitting: Cardiology

## 2023-10-14 VITALS — BP 141/78 | HR 84 | Ht 72.0 in | Wt 220.0 lb

## 2023-10-14 DIAGNOSIS — R072 Precordial pain: Secondary | ICD-10-CM

## 2023-10-14 DIAGNOSIS — I2510 Atherosclerotic heart disease of native coronary artery without angina pectoris: Secondary | ICD-10-CM

## 2023-10-14 DIAGNOSIS — I10 Essential (primary) hypertension: Secondary | ICD-10-CM

## 2023-10-14 DIAGNOSIS — E7800 Pure hypercholesterolemia, unspecified: Secondary | ICD-10-CM | POA: Diagnosis not present

## 2023-10-14 NOTE — Patient Instructions (Signed)

## 2023-10-15 ENCOUNTER — Ambulatory Visit

## 2023-10-15 VITALS — Ht 72.0 in | Wt 218.0 lb

## 2023-10-15 DIAGNOSIS — Z0000 Encounter for general adult medical examination without abnormal findings: Secondary | ICD-10-CM | POA: Diagnosis not present

## 2023-10-15 NOTE — Patient Instructions (Signed)
  Mr. Dubray , Thank you for taking time to come for your Medicare Wellness Visit. I appreciate your ongoing commitment to your health goals. Please review the following plan we discussed and let me know if I can assist you in the future.   These are the goals we discussed:  Goals       Patient Stated (pt-stated)      Patient stated that he would like to loose 10 lbs.      Patient Stated      Patient states he would like to lose 10 lbs.         This is a list of the screening recommended for you and due dates:  Health Maintenance  Topic Date Due   Hepatitis C Screening  Never done   COVID-19 Vaccine (3 - 2024-25 season) 12/16/2022   Flu Shot  11/15/2023   Medicare Annual Wellness Visit  10/14/2024   DTaP/Tdap/Td vaccine (3 - Td or Tdap) 03/15/2032   Pneumococcal Vaccine for age over 98  Completed   Zoster (Shingles) Vaccine  Completed   Hepatitis B Vaccine  Aged Out   HPV Vaccine  Aged Out   Meningitis B Vaccine  Aged Out   Screening for Lung Cancer  Discontinued   Colon Cancer Screening  Discontinued

## 2023-10-15 NOTE — Progress Notes (Addendum)
 Subjective:   Earl [name] is a 78 y.o. male who presents for Medicare Annual/Subsequent preventive examination.  Visit Complete: Virtual I connected with  Earl [name] on 10/15/23 by a audio enabled telemedicine application and verified that I am speaking with the correct person using two identifiers.  Patient Location: Home  Provider Location: Office/Clinic  I discussed the limitations of evaluation and management by telemedicine. The patient expressed understanding and agreed to proceed.  Vital Signs: Because this visit was a virtual/telehealth visit, some criteria may be missing or patient reported. Any vitals not documented were not able to be obtained and vitals that have been documented are patient reported.  Patient Medicare AWV questionnaire was completed by the patient on 10/12/2023; I have confirmed that all information answered by patient is correct and no changes since this date.  Cardiac Risk Factors include: advanced age (>10men, >21 women);male gender;obesity (BMI >30kg/m2);smoking/ tobacco exposure;hypertension;dyslipidemia;sedentary lifestyle;family history of premature cardiovascular disease     Objective:    Today'[name] Vitals   10/15/23 1531  Weight: 218 lb (98.9 kg)  Height: 6' (1.829 m)   Body mass index is 29.57 kg/m.     10/15/2023    3:39 PM 09/18/2022    2:02 PM 02/15/2022   11:29 AM  Advanced Directives  Does Patient Have a Medical Advance Directive? No No No  Would patient like information on creating a medical advance directive? No - Patient declined Yes (MAU/Ambulatory/Procedural Areas - Information given) No - Patient declined    Current Medications (verified) Outpatient Encounter Medications as of 10/15/2023  Medication Sig   amLODipine  (NORVASC ) 10 MG tablet TAKE 1 TABLET BY MOUTH EVERY DAY   aspirin 81 MG chewable tablet Chew by mouth.   ezetimibe  (ZETIA ) 10 MG tablet Take 1 tablet (10 mg total) by mouth daily.   hydrochlorothiazide (HYDRODIURIL)  25 MG tablet TAKE 1 TABLET BY MOUTH EVERY DAY   levothyroxine (SYNTHROID) 88 MCG tablet TAKE 1 TABLET BY MOUTH EVERY DAY   lisinopril (ZESTRIL) 40 MG tablet TAKE 1 TABLET BY MOUTH EVERY DAY   RESTASIS 0.05 % ophthalmic emulsion Place 1 drop into both eyes 2 (two) times daily.   rosuvastatin  (CRESTOR ) 40 MG tablet Take 1 tablet (40 mg total) by mouth daily.   No facility-administered encounter medications on file as of 10/15/2023.    Allergies (verified) Niacin   History: Past Medical History:  Diagnosis Date   Allergy    Niacin   Anxiety 09/03/2022   Court date for equitable distributions now in tenth year.   Aorta aneurysm (HCC)    Cataract 2022   Both eyes surgery 07/2021   Colon polyp    Heart murmur Years ago   None   Hyperlipidemia    Hypertension    Kidney disease    Thyroid disease During routine tests   Reaction to exposure to agent orange   Past Surgical History:  Procedure Laterality Date   ABDOMINAL AORTIC ANEURYSM REPAIR  2019   Dr. Georgina ([facility])   APPENDECTOMY  2023   CATARACT EXTRACTION     EYE SURGERY  April 2024   Cataract surgery/both eyes   VASECTOMY     Family History  Problem Relation Age of Onset   Diabetes Mother    Hypertension Mother    Atrial fibrillation Father    Kidney disease Father    Diabetes Other    Social History   Socioeconomic History   Marital status: Married    Spouse  name: Nena   Number of children: 2   Years of education: 13   Highest education level: Associate degree: occupational, Scientist, product/process development, or vocational program  Occupational History   Occupation: Retired.  Tobacco Use   Smoking status: Former    Current packs/day: 0.00    Average packs/day: 1 pack/day for 15.0 years (15.0 ttl pk-yrs)    Types: Cigarettes    Start date: 2002    Quit date: 02/21/2015    Years since quitting: 8.6   Smokeless tobacco: Never  Vaping Use   Vaping status: Never Used  Substance and Sexual Activity   Alcohol use: Not  Currently    Alcohol/week: 2.0 standard drinks of alcohol    Types: 2 Shots of liquor per week    Comment: One to two mixed drinks per month   Drug use: Never   Sexual activity: Yes    Birth control/protection: Other-see comments, None    Comment: Vasectomy  Other Topics Concern   Not on file  Social History Narrative   Lives with his wife. He has two children and one step child. He enjoys staying active in the community and church.  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    Social Drivers of Corporate investment banker Strain: Low Risk  (10/15/2023)   Overall Financial Resource Strain (CARDIA)    Difficulty of Paying Living Expenses: Not hard at all  Food Insecurity: No Food Insecurity (10/15/2023)   Hunger Vital Sign    Worried About Running Out of Food in the Last Year: Never true    Ran Out of Food in the Last Year: Never true  Transportation Needs: No Transportation Needs (10/15/2023)   PRAPARE - Administrator, Civil Service (Medical): No    Lack of Transportation (Non-Medical): No  Physical Activity: Sufficiently Active (10/15/2023)   Exercise Vital Sign    Days of Exercise per Week: 3 days    Minutes of Exercise per Session: 60 min  Recent Concern: Physical Activity - Insufficiently Active (10/11/2023)   Exercise Vital Sign    Days of Exercise per Week: 3 days    Minutes of Exercise per Session: 30 min  Stress: No Stress Concern Present (10/15/2023)   [employer] of Occupational Health - Occupational Stress Questionnaire    Feeling of Stress: Not at all  Recent Concern: Stress - Stress Concern Present (10/11/2023)   [employer] of Occupational Health - Occupational Stress Questionnaire    Feeling of Stress: To some extent  Social Connections: Socially Integrated (10/15/2023)   Social Connection and Isolation Panel    Frequency of Communication with Friends and Family: More than three times a week    Frequency of Social Gatherings with  Friends and Family: More than three times a week    Attends Religious Services: More than 4 times per year    Active Member of [employer] or Organizations: Yes    Attends Engineer, structural: More than 4 times per year    Marital Status: Married    Tobacco Counseling Counseling given: Not Answered   Clinical Intake:  Pre-visit preparation completed: Yes  Pain : No/denies pain     BMI - recorded: 29.57 Nutritional Status: BMI 25 -29 Overweight Nutritional Risks: None Diabetes: No  How often Earl [name] you need to have someone help you when you read instructions, pamphlets, or other written materials from your doctor or pharmacy?: 1 - Never What is the last grade level  you completed in school?: 12  Interpreter Needed?: No      Activities of Daily Living    10/15/2023    3:33 PM 10/11/2023   10:57 AM  In your present state of health, Earl [name] you have any difficulty performing the following activities:  Hearing? 0 0  Vision? 0 0  Difficulty concentrating or making decisions? 0 0  Walking or climbing stairs? 0 0  Dressing or bathing? 0 0  Doing errands, shopping? 0 0  Preparing Food and eating ? N N  Using the Toilet? N N  In the past six months, have you accidently leaked urine? N N  Earl [name] you have problems with loss of bowel control? N N  Managing your Medications? N N  Managing your Finances? N N  Housekeeping or managing your Housekeeping? N N    Patient Care Team: Earl Bernice RAMAN, Earl [name] (Inactive) as PCP - General (Family Medicine) Earl Redell RAMAN, MD as Consulting Physician (Cardiology)  Indicate any recent Medical Services you may have received from other than [facility] providers in the past year (date may be approximate).     Assessment:   This is a routine wellness examination for Earl [name].  Hearing/Vision screen No results found.   Goals Addressed             This Visit'[name] Progress    Patient Stated       Patient states he would like to lose 10 lbs.         Depression Screen    10/15/2023    3:38 PM 09/10/2023    9:33 AM 09/10/2023    9:11 AM 10/25/2022    3:29 PM 09/18/2022    2:10 PM 08/21/2022    1:57 PM 02/15/2022   11:30 AM  PHQ 2/9 Scores  PHQ - 2 Score 0 0 0 0 0 0 0  PHQ- 9 Score  0     0    Fall Risk    10/15/2023    3:39 PM 10/11/2023   10:57 AM 09/10/2023    9:11 AM 10/25/2022    3:29 PM 09/18/2022    2:10 PM  Fall Risk   Falls in the past year? 0 0 0 0 0  Number falls in past yr: 0 0 0 0 0  Injury with Fall? 0  0 0 0  Risk for fall due to : No Fall Risks  No Fall Risks No Fall Risks No Fall Risks  Follow up Falls evaluation completed  Falls evaluation completed Falls evaluation completed Falls evaluation completed    MEDICARE RISK AT HOME: Medicare Risk at Home Any stairs in or around the home?: Yes If so, are there any without handrails?: No Home free of loose throw rugs in walkways, pet beds, electrical cords, etc?: No Adequate lighting in your home to reduce risk of falls?: Yes Life alert?: No Use of a cane, walker or w/c?: No Grab bars in the bathroom?: No Shower chair or bench in shower?: No Elevated toilet seat or a handicapped toilet?: No  TIMED UP AND GO:  Was the test performed?  No    Cognitive Function:        10/15/2023    3:40 PM 09/18/2022    2:15 PM  6CIT Screen  What Year? 0 points 0 points  What month? 0 points 0 points  What time? 0 points 0 points  Count back from 20 0 points 0 points  Months in reverse 0 points 0 points  Repeat phrase 0 points 0 points  Total Score 0 points 0 points    Immunizations Immunization History  Administered Date([name]) Administered   Fluad Quad(high Dose 65+) 01/11/2017, 02/17/2018, 01/01/2020, 02/13/2021   Fluad Trivalent(High Dose 65+) 01/18/2023   Influenza, High Dose Seasonal PF 03/08/2015   Influenza,inj,Quad PF,6+ Mos 02/15/2022   Influenza-Unspecified 04/29/2007, 12/02/2018   Moderna Sars-Covid-2 Vaccination 05/23/2019, 06/22/2019   Pneumococcal  Conjugate-13 07/09/2016   Pneumococcal Polysaccharide-23 05/23/2018   Tdap 12/02/2018, 03/15/2022   Zoster Recombinant(Shingrix) 12/02/2018, 03/09/2019   Zoster, Live 12/13/2015    TDAP status: Up to date  Flu Vaccine status: Up to date  Pneumococcal vaccine status: Up to date  Covid-19 vaccine status: Information provided on how to obtain vaccines.   Qualifies for Shingles Vaccine? Yes   Zostavax completed No   Shingrix Completed?: Yes  Screening Tests Health Maintenance  Topic Date Due   Hepatitis C Screening  Never done   COVID-19 Vaccine (3 - 2024-25 season) 12/16/2022   INFLUENZA VACCINE  11/15/2023   Medicare Annual Wellness (AWV)  10/14/2024   DTaP/Tdap/Td (3 - Td or Tdap) 03/15/2032   Pneumococcal Vaccine: 50+ Years  Completed   Zoster Vaccines- Shingrix  Completed   Hepatitis B Vaccines  Aged Out   HPV VACCINES  Aged Out   Meningococcal B Vaccine  Aged Out   Lung Cancer Screening  Discontinued   Colonoscopy  Discontinued    Health Maintenance  Health Maintenance Due  Topic Date Due   Hepatitis C Screening  Never done   COVID-19 Vaccine (3 - 2024-25 season) 12/16/2022    Colorectal cancer screening: Type of screening: Colonoscopy. Completed 02/08/2021. Repeat every 10 years  Lung Cancer Screening: (Low Dose CT Chest recommended if Age 70-80 years, 20 pack-year currently smoking OR have quit w/in 15years.) does not qualify.   Lung Cancer Screening Referral:   Additional Screening:  Hepatitis C Screening: does not qualify; Completed   Vision Screening: Recommended annual ophthalmology exams for early detection of glaucoma and other disorders of the eye. Is the patient up to date with their annual eye exam?  Yes  Who is the provider or what is the name of the office in which the patient attends annual eye exams? Dr Rudy If pt is not established with a provider, would they like to be referred to a provider to establish care? N/a.   Dental Screening:  Recommended annual dental exams for proper oral hygiene   Community Resource Referral / Chronic Care Management: CRR required this visit?  No   CCM required this visit?  No     Plan:     I have personally reviewed and noted the following in the patient'[name] chart:   Medical and social history Use of alcohol, tobacco or illicit drugs  Current medications and supplements including opioid prescriptions. Patient is not currently taking opioid prescriptions. Functional ability and status Nutritional status Physical activity Advanced directives List of other physicians Hospitalizations, surgeries, and ER # 1 visits in previous 12 months Vitals Screenings to include cognitive, depression, and falls Referrals and appointments  In addition, I have reviewed and discussed with patient certain preventive protocols, quality metrics, and best practice recommendations. A written personalized care plan for preventive services as well as general preventive health recommendations were provided to patient.     Earl [name], CMA   10/15/2023   After Visit Summary: (MyChart) Due to this being a telephonic visit, the after visit summary with patients personalized plan was offered  to patient via MyChart   Nurse Notes:   Earl [name] is a 78 y.o. male patient of Earl, Earl [name], Earl [name] who had a Medicare Annual Wellness Visit today via telephone. Earl [name] is Retired and lives with their spouse. He has 2 children. He reports that he is socially active and does interact with friends/family regularly. He is moderately physically active and enjoys staying active in the community and church.

## 2023-10-16 ENCOUNTER — Other Ambulatory Visit: Payer: Self-pay | Admitting: Cardiology

## 2023-10-16 DIAGNOSIS — E7800 Pure hypercholesterolemia, unspecified: Secondary | ICD-10-CM

## 2023-10-25 ENCOUNTER — Other Ambulatory Visit: Payer: Self-pay | Admitting: Family Medicine

## 2023-10-25 DIAGNOSIS — I10 Essential (primary) hypertension: Secondary | ICD-10-CM

## 2023-10-25 NOTE — Telephone Encounter (Unsigned)
 Copied from CRM [id number]. Topic: Clinical - Medication Refill >> Oct 25, 2023  4:26 PM Vivian Z wrote: Medication: hydrochlorothiazide  (HYDRODIURIL ) 25 MG tablet  Has the patient contacted their pharmacy? Yes (Agent: If no, request that the patient contact the pharmacy for the refill. If patient does not wish to contact the pharmacy document the reason why and proceed with request.) (Agent: If yes, when and what did the pharmacy advise?)  This is the patient's preferred pharmacy:  [id number] [place], [place] - [address] [facility] [address] Phone: [phone] Fax: [phone]  Is this the correct pharmacy for this prescription? Yes If no, delete pharmacy and type the correct one.   Has the prescription been filled recently? No  Is the patient out of the medication? No  Has the patient been seen for an appointment in the last year OR does the patient have an upcoming appointment? Yes  Can we respond through MyChart? No  Agent: Please be advised that Rx refills may take up to 3 business days. We ask that you follow-up with your pharmacy.

## 2023-10-28 MED ORDER — HYDROCHLOROTHIAZIDE 25 MG PO TABS: 25.0000 mg | ORAL_TABLET | Freq: Every day | ORAL | 1 refills | Status: DC

## 2023-11-19 ENCOUNTER — Other Ambulatory Visit: Payer: Self-pay | Admitting: Family Medicine

## 2023-11-19 DIAGNOSIS — I10 Essential (primary) hypertension: Secondary | ICD-10-CM

## 2023-11-19 NOTE — Telephone Encounter (Unsigned)
 Copied from CRM #[id number]. Topic: Clinical - Medication Refill >> Nov 19, 2023 11:42 AM Susanna ORN wrote: Medication: amLODipine  (NORVASC ) 10 MG tablet & lisinopril  (ZESTRIL ) 40 MG tablet  Has the patient contacted their pharmacy? Yes, pharmacy contacted patient & stated that provider is not responding. Advised that's because Dr. Bevin is no longer with the company. (Agent: If no, request that the patient contact the pharmacy for the refill. If patient does not wish to contact the pharmacy document the reason why and proceed with request.) (Agent: If yes, when and what did the pharmacy advise?)  This is the patient's preferred pharmacy:  [id number] [place], [place] - [address] [place] [facility] [address] Phone: [phone] Fax: [phone]  Is this the correct pharmacy for this prescription? Yes If no, delete pharmacy and type the correct one.   Has the prescription been filled recently? Yes  Is the patient out of the medication? Yes  Has the patient been seen for an appointment in the last year OR does the patient have an upcoming appointment? Yes  Can we respond through MyChart? Yes  Agent: Please be advised that Rx refills may take up to 3 business days. We ask that you follow-up with your pharmacy.

## 2023-11-20 MED ORDER — AMLODIPINE BESYLATE 10 MG PO TABS: 10.0000 mg | ORAL_TABLET | Freq: Every day | ORAL | 1 refills | Status: DC

## 2023-11-20 MED ORDER — LISINOPRIL 40 MG PO TABS: 40.0000 mg | ORAL_TABLET | Freq: Every day | ORAL | 2 refills | Status: AC

## 2023-12-20 ENCOUNTER — Ambulatory Visit
Admission: RE | Admit: 2023-12-20 | Discharge: 2023-12-20 | Disposition: A | Discharge status: Home / Self Care | Source: Ambulatory Visit | Attending: Family Medicine | Admitting: Family Medicine

## 2023-12-20 VITALS — BP 142/87 | HR 98 | Temp 98.2°F | Resp 16

## 2023-12-20 DIAGNOSIS — R0981 Nasal congestion: Secondary | ICD-10-CM

## 2023-12-20 DIAGNOSIS — J0100 Acute maxillary sinusitis, unspecified: Secondary | ICD-10-CM

## 2023-12-20 MED ORDER — AMOXICILLIN-POT CLAVULANATE 875-125 MG PO TABS: 1.0000 | ORAL_TABLET | Freq: Two times a day (BID) | ORAL | 0 refills | Status: DC

## 2023-12-20 MED ORDER — PREDNISONE 20 MG PO TABS: ORAL_TABLET | ORAL | 0 refills | Status: DC

## 2023-12-20 NOTE — Discharge Instructions (Addendum)
 Advised patient to take medications as directed with food to completion.  Advised patient to take prednisone with first dose of Augmentin for the next 5 of 7 days.  Encouraged to increase daily water intake to 64 ounces per day while taking these medications.  Advised if symptoms worsen and/or unresolved please follow-up with your PCP or here for further evaluation.

## 2023-12-20 NOTE — ED Triage Notes (Signed)
 [facility] 1 week of cough, congestion, sinus pain and fever. Pt reports he has been taking coricidin for symptoms.

## 2023-12-20 NOTE — ED Provider Notes (Signed)
 [name]    CSN: [id number] Arrival date & time: 12/20/23  9187      History   Chief Complaint Chief Complaint  Patient presents with   Cough    Sinus pain,fever - Entered by patient    HPI Earl [name] is a 78 y.o. male.   HPI Pleasant 78 year old male presents with cough, congestion and sinus pain with fever for 1 week.  PMH significant for aortic aneurysm, HTN, hypothyroidism, and HLD.  Past Medical History:  Diagnosis Date   Allergy    Niacin   Anxiety 09/03/2022   Court date for equitable distributions now in tenth year.   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 Georgina ([facility])   APPENDECTOMY  2023   CATARACT EXTRACTION     EYE SURGERY  April 2024   Cataract surgery/both eyes   VASECTOMY         Home Medications    Prior to Admission medications   Medication Sig Start Date End Date Taking? Authorizing Provider  amoxicillin -clavulanate (AUGMENTIN ) 875-125 MG tablet Take 1 tablet by mouth every 12 (twelve) hours. 12/20/23  Yes Teddy Sharper, FNP  predniSONE  (DELTASONE ) 20 MG tablet Take 3 tabs PO daily x 5 days. 12/20/23  Yes Teddy Sharper, FNP  amLODipine  (NORVASC ) 10 MG tablet Take 1 tablet (10 mg total) by  mouth daily. 11/20/23   Alvan Dorothyann BIRCH, MD  aspirin 81 MG chewable tablet Chew by mouth.    [provider]  ezetimibe  (ZETIA ) 10 MG tablet TAKE 1 TABLET BY MOUTH EVERY DAY 10/16/23   Pietro Redell RAMAN, MD  hydrochlorothiazide  (HYDRODIURIL ) 25 MG tablet Take 1 tablet (25 mg total) by mouth daily. 10/28/23   Alvan Dorothyann BIRCH, MD  levothyroxine (SYNTHROID) 88 MCG tablet TAKE 1 TABLET BY MOUTH EVERY DAY 05/26/23   Bevin Bernice RAMAN, DO  lisinopril  (ZESTRIL ) 40 MG tablet Take 1 tablet (40 mg total) by mouth daily. 11/20/23   Alvan Dorothyann BIRCH, MD  RESTASIS 0.05 % ophthalmic emulsion Place 1 drop into both eyes 2 (two) times daily. 06/29/22   [provider]  rosuvastatin  (CRESTOR ) 40 MG tablet Take 1 tablet (40 mg total) by mouth daily. 09/10/23 12/09/23  Alvan Dorothyann BIRCH, MD    Family History Family History  Problem Relation Age of Onset   Diabetes Mother    Hypertension Mother    Atrial fibrillation Father    Kidney disease Father    Diabetes Other     Social History Social History   Tobacco Use   Smoking status: Former    Current packs/day: 0.00    Average packs/day: 1 pack/day for 15.0 years (15.0  ttl pk-yrs)    Types: Cigarettes    Start date: 2002    Quit date: 02/21/2015    Years since quitting: 8.8   Smokeless tobacco: Never  Vaping Use   Vaping status: Never Used  Substance Use Topics   Alcohol use: Not Currently    Alcohol/week: 2.0 standard drinks of alcohol    Types: 2 Shots of liquor per week    Comment: One to two mixed drinks per month   Drug use: Never     Allergies   Niacin   Review of Systems Review of Systems  HENT:  Positive for congestion, sinus pressure and sinus pain.   Respiratory:  Positive for cough.   All other systems reviewed and are negative.    Physical Exam Triage Vital Signs ED Triage Vitals  Encounter Vitals Group     BP 12/20/23 0832 (!) 142/87     Girls Systolic BP Percentile --      Girls Diastolic BP  Percentile --      Boys Systolic BP Percentile --      Boys Diastolic BP Percentile --      Pulse Rate 12/20/23 0832 98     Resp 12/20/23 0832 16     Temp 12/20/23 0832 98.2 F (36.8 C)     Temp src --      SpO2 12/20/23 0832 98 %     Weight --      Height --      Head Circumference --      Peak Flow --      Pain Score 12/20/23 0830 8     Pain Loc --      Pain Education --      Exclude from Growth Chart --    No data found.  Updated Vital Signs BP (!) 142/87   Pulse 98   Temp 98.2 F (36.8 C)   Resp 16   SpO2 98%    Physical Exam Vitals and nursing note reviewed.  Constitutional:      Appearance: Normal appearance. He is obese. He is ill-appearing.  HENT:     Head: Normocephalic and atraumatic.     Right Ear: External ear normal.     Left Ear: External ear normal.     Ears:     Comments: Bilateral TM's: Cloudy, retracted; significant eustachian tube dysfunction noted bilaterally    Mouth/Throat:     Mouth: Mucous membranes are moist.     Pharynx: Oropharynx is clear.  Eyes:     Extraocular Movements: Extraocular movements intact.     Pupils: Pupils are equal, round, and reactive to light.  Cardiovascular:     Rate and Rhythm: Normal rate and regular rhythm.     Pulses: Normal pulses.     Heart sounds: Normal heart sounds.  Pulmonary:     Effort: Pulmonary effort is normal.     Breath sounds: Normal breath sounds. No wheezing, rhonchi or rales.  Musculoskeletal:        General: Normal range of motion.     Cervical back: Normal range of motion and neck supple.  Skin:    General: Skin is warm and dry.  Neurological:     General: No focal deficit present.     Mental Status: He is alert and oriented to person, place, and time. Mental status is at baseline.  Psychiatric:        Mood and Affect: Mood normal.        Behavior:  Behavior normal.      UC Treatments / Results  Labs (all labs ordered are listed, but only abnormal results are displayed) Labs  Reviewed - No data to display  EKG   Radiology No results found.  Procedures Procedures (including critical care time)  Medications Ordered in UC Medications - No data to display  Initial Impression / Assessment and Plan / UC Course  I have reviewed the triage vital signs and the nursing notes.  Pertinent labs & imaging results that were available during my care of the patient were reviewed by me and considered in my medical decision making (see chart for details).     MDM: 1.  Acute maxillary sinusitis, recurrence not specified-Rx'd Augmentin  875/125 mg tablet: Take 1 tablet twice daily x 7 days; 2.  Nasal sinus congestion-Rx'd prednisone  20 mg tablet: Take 3 tablets p.o. daily x 5-day. Advised patient to take medications as directed with food to completion.  Advised patient to take prednisone  with first dose of Augmentin  for the next 5 of 7 days.  Encouraged to increase daily water intake to 64 ounces per day while taking these medications.  Advised if symptoms worsen and/or unresolved please follow-up with your PCP or here for further evaluation.  Patient discharged home, hemodynamically stable. Final Clinical Impressions(s) / UC Diagnoses   Final diagnoses:  Acute maxillary sinusitis, recurrence not specified  Nasal sinus congestion     Discharge Instructions      Advised patient to take medications as directed with food to completion.  Advised patient to take prednisone  with first dose of Augmentin  for the next 5 of 7 days.  Encouraged to increase daily water intake to 64 ounces per day while taking these medications.  Advised if symptoms worsen and/or unresolved please follow-up with your PCP or here for further evaluation.     ED Prescriptions     Medication Sig Dispense Auth. Provider   amoxicillin -clavulanate (AUGMENTIN ) 875-125 MG tablet Take 1 tablet by mouth every 12 (twelve) hours. 14 tablet Almee Pelphrey, FNP   predniSONE  (DELTASONE ) 20 MG tablet Take 3 tabs  PO daily x 5 days. 15 tablet Bryannah Boston, FNP      PDMP not reviewed this encounter.   Teddy Sharper, FNP 12/20/23 [phone]

## 2024-01-13 ENCOUNTER — Encounter: Payer: Self-pay | Admitting: Family Medicine

## 2024-01-13 ENCOUNTER — Ambulatory Visit: Admitting: Family Medicine

## 2024-01-13 VITALS — BP 131/68 | HR 83 | Ht 72.0 in | Wt 217.1 lb

## 2024-01-13 DIAGNOSIS — N1831 Chronic kidney disease, stage 3a: Secondary | ICD-10-CM | POA: Diagnosis not present

## 2024-01-13 DIAGNOSIS — R7303 Prediabetes: Secondary | ICD-10-CM | POA: Diagnosis not present

## 2024-01-13 DIAGNOSIS — I10 Essential (primary) hypertension: Secondary | ICD-10-CM

## 2024-01-13 DIAGNOSIS — E039 Hypothyroidism, unspecified: Secondary | ICD-10-CM | POA: Diagnosis not present

## 2024-01-13 DIAGNOSIS — Z77098 Contact with and (suspected) exposure to other hazardous, chiefly nonmedicinal, chemicals: Secondary | ICD-10-CM | POA: Insufficient documentation

## 2024-01-13 DIAGNOSIS — E785 Hyperlipidemia, unspecified: Secondary | ICD-10-CM

## 2024-01-13 NOTE — Assessment & Plan Note (Signed)
 Stable on rosuvastatin . No changes by cardiologist. - Continue rosuvastatin  as prescribed.

## 2024-01-13 NOTE — Assessment & Plan Note (Signed)
 Potential exacerbation by Agent Orange exposure. No symptoms. TSH levels needed. - Order TSH test to assess thyroid function and medication efficacy. - Perform TSH test today before vacation.

## 2024-01-13 NOTE — Progress Notes (Signed)
 Established Patient Office Visit  Subjective  Patient ID: Earl [name], male    DOB: 1945/11/05  Age: 78 y.o. MRN: [id number]  Chief Complaint  Patient presents with   Hypertension   ifg   Hypothyroidism    HPI  Discussed the use of AI scribe software for clinical note transcription with the patient, who gave verbal consent to proceed.  History of Present Illness Earl [name] is a 78 year old male who presents for follow-up after a recent respiratory infection.  Recent respiratory infection - Developed cough on the second day of exposure to drywall and sawdust during home renovations - Significant respiratory infection followed exposure - Visited urgent care approximately 2-3 weeks ago - Prescribed amoxicillin , with symptom resolution by the second dose - No current cough, chest pain, or shortness of breath  Cardiovascular and lipid management - No current chest pain, shortness of breath, or unusual symptoms related to blood pressure - Cardiology appointment in June or July with no changes to medication regimen - Continues rosuvastatin  for cholesterol management  Thyroid dysfunction - History of hypothyroidism - Significant exposure to Agent Orange 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  Constitutional:      Appearance: Normal appearance.  HENT:     Head: Normocephalic and atraumatic.  Eyes:     Conjunctiva/sclera: Conjunctivae normal.  Cardiovascular:     Rate and Rhythm: Normal rate and regular rhythm.  Pulmonary:     Effort: Pulmonary effort is normal.     Breath sounds: Normal  breath sounds.  Skin:    General: Skin is warm and dry.  Neurological:     Mental Status: He is alert.  Psychiatric:        Mood and Affect: Mood normal.      No results found for any visits on 01/13/24.    The ASCVD Risk score (Arnett DK, et al., 2019) failed to calculate for the following reasons:   The valid total cholesterol range is 130 to 320 mg/dL    Assessment & Plan:   Problem List Items Addressed This Visit       Cardiovascular and Mediastinum   Primary hypertension   Well controlled. Continue current regimen. Follow up in  109mo       Relevant Orders   TSH   HgB A1c   BMP8+EGFR     Endocrine   Acquired hypothyroidism   Potential exacerbation by Agent Orange exposure. No symptoms. TSH levels needed. - Order TSH test to assess thyroid function and medication efficacy. - Perform TSH test today before vacation.      Relevant Orders   TSH   HgB A1c   BMP8+EGFR     Genitourinary   CKD stage 3a, GFR 45-59 ml/min (HCC)   Relevant Orders   TSH   HgB A1c   BMP8+EGFR     Other   Prediabetes   Due for updated A1C       Relevant Orders   TSH   HgB A1c   BMP8+EGFR  Hyperlipidemia, GOAL < 70   Stable on rosuvastatin . No changes by cardiologist. - Continue rosuvastatin  as prescribed.      Exposure to [employer]   Other Visit Diagnoses       Essential (primary) hypertension    -  Primary   Relevant Orders   TSH   HgB A1c   BMP8+EGFR       Assessment and Plan Assessment & Plan        Return in about 6 months (around 07/12/2024) for Hypertension, Pre-diabetes, Check kidneys .    Earl Byars, MD

## 2024-01-13 NOTE — Assessment & Plan Note (Signed)
 Well controlled. Continue current regimen. Follow up in  6 mo

## 2024-01-13 NOTE — Assessment & Plan Note (Signed)
 Due for updated A1C.

## 2024-01-13 NOTE — Assessment & Plan Note (Signed)
Monitor renal function Q 6 mo

## 2024-01-14 ENCOUNTER — Ambulatory Visit: Payer: Self-pay | Admitting: Family Medicine

## 2024-01-14 LAB — BMP8+EGFR
BUN/Creatinine Ratio: 11 (ref 10–24)
BUN: 15 mg/dL (ref 8–27)
CO2: 21 mmol/L (ref 20–29)
Calcium: 9.6 mg/dL (ref 8.6–10.2)
Chloride: 101 mmol/L (ref 96–106)
Creatinine, Ser: 1.38 mg/dL — ABNORMAL HIGH (ref 0.76–1.27)
Glucose: 114 mg/dL — ABNORMAL HIGH (ref 70–99)
Potassium: 4.3 mmol/L (ref 3.5–5.2)
Sodium: 139 mmol/L (ref 134–144)
eGFR: 53 mL/min/1.73 — ABNORMAL LOW (ref >59)

## 2024-01-14 LAB — TSH: TSH: 1.48 u[IU]/mL (ref 0.450–4.500)

## 2024-01-14 LAB — HEMOGLOBIN A1C
Est. average glucose Bld gHb Est-mCnc: 120 mg/dL
Hgb A1c MFr Bld: 5.8 % — ABNORMAL HIGH (ref 4.8–5.6)

## 2024-01-14 NOTE — Progress Notes (Signed)
 HI Earl [name] your A1C is 5.8. Up a little from last time but overall you have done a great job of keeping your A1C under 6.0. Kidney function is stable.  Thyroid is normal

## 2024-03-06 ENCOUNTER — Ambulatory Visit
Admission: RE | Admit: 2024-03-06 | Discharge: 2024-03-06 | Disposition: A | Discharge status: Home / Self Care | Attending: Family Medicine | Admitting: Family Medicine

## 2024-03-06 VITALS — BP 127/76 | HR 84 | Temp 98.3°F | Resp 18 | Ht 72.0 in | Wt 218.0 lb

## 2024-03-06 DIAGNOSIS — J209 Acute bronchitis, unspecified: Secondary | ICD-10-CM | POA: Diagnosis not present

## 2024-03-06 MED ORDER — PREDNISONE 20 MG PO TABS: ORAL_TABLET | ORAL | 0 refills | Status: AC

## 2024-03-06 MED ORDER — AMOXICILLIN-POT CLAVULANATE 875-125 MG PO TABS
ORAL_TABLET | ORAL | 0 refills | Status: AC
Start: 2024-03-06 — End: ?

## 2024-03-06 NOTE — ED Triage Notes (Signed)
 Patient c/o cough and congestion x 1 week, afebrile, nasal drainage.  Patient has taken Chlorcidin HBP for his sx's.

## 2024-03-06 NOTE — Discharge Instructions (Signed)
 Take plain guaifenesin (1200mg  extended release tabs such as Mucinex) twice daily, with plenty of water, for cough and congestion. Get adequate rest.   May use Afrin nasal spray (or generic oxymetazoline) each morning for about 5 days and then discontinue.  Also recommend using saline nasal spray several times daily and saline nasal irrigation (AYR is a common brand).  Use Flonase nasal spray each morning after using Afrin nasal spray and saline nasal irrigation. May take Delsym Cough Suppressant (12 Hour Cough Relief) at bedtime for nighttime cough.  Stop all antihistamines (Coricidin, etc) for now, and other non-prescription cough/cold preparations.   If symptoms become significantly worse during the night or over the weekend, proceed to the local emergency room.

## 2024-03-06 NOTE — ED Provider Notes (Signed)
 [name]    CSN: [id number] Arrival date & time: 03/06/24  1021      History   Chief Complaint Chief Complaint  Patient presents with   Cough    Extremely congested..mild sore throat.Have had these symptoms over a week. - Entered by patient    HPI Earl [name] is a 78 y.o. male.   Patient developed cold-like symptoms about 8 to 9 days ago. He has generally improved but his nasal drainage and sputum have become thick and green, and he has had sweats during the past two nights.  He denies pleuritic pain and shortness of breath.  His symptoms have not improved by taking Coricidin.  The history is provided by the patient.    Past Medical History:  Diagnosis Date   Allergy    Niacin   Anxiety 09/03/2022   Court date for equitable distributions now in tenth year.   Aorta aneurysm    Cataract 2022   Both eyes surgery 07/2021   Colon polyp    Heart murmur Years ago   None   Hyperlipidemia    Hypertension    Kidney disease    Thyroid disease During routine tests   Reaction to exposure to agent orange    Patient Active Problem List   Diagnosis Date Noted   Exposure to Agent Orange 01/13/2024   PTSD (post-traumatic stress disorder) 09/12/2023   History of aortic aneurysm 09/10/2023   Hyperlipidemia, GOAL < 70 09/10/2023   Rash 10/25/2022   Stress 08/21/2022   Primary hypertension 02/15/2022   Prediabetes 02/15/2022   Acquired hypothyroidism 02/15/2022   CKD stage 3a, GFR 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 Georgina ([facility])   APPENDECTOMY  2023   CATARACT EXTRACTION     EYE SURGERY  April 2024   Cataract surgery/both eyes   VASECTOMY         Home Medications    Prior to Admission medications   Medication Sig Start Date End Date Taking? Authorizing Provider  amLODipine  (NORVASC ) 10 MG  tablet Take 1 tablet (10 mg total) by mouth daily. 11/20/23  Yes Alvan Dorothyann BIRCH, MD  amoxicillin -clavulanate (AUGMENTIN ) 875-125 MG tablet Take one tab PO Q12hr with food 03/06/24  Yes Pauline Garnette LABOR, MD  aspirin 81 MG chewable tablet Chew by mouth.   Yes [provider]  ezetimibe  (ZETIA ) 10 MG tablet TAKE 1 TABLET BY MOUTH EVERY DAY 10/16/23  Yes Pietro Redell RAMAN, MD  hydrochlorothiazide  (HYDRODIURIL ) 25 MG tablet Take 1 tablet (25 mg total) by mouth daily. 10/28/23  Yes Alvan Dorothyann BIRCH, MD  levothyroxine (SYNTHROID) 88 MCG tablet TAKE 1 TABLET BY MOUTH EVERY DAY 05/26/23  Yes Bevin, Erika S, DO  lisinopril  (ZESTRIL ) 40 MG tablet Take 1 tablet (40 mg total) by mouth daily. 11/20/23  Yes Alvan Dorothyann BIRCH, MD  predniSONE  (DELTASONE ) 20 MG tablet Take one tab by mouth twice daily for 4 days, then one daily for 3 days. Take with food. 03/06/24  Yes Pauline Garnette LABOR, MD  RESTASIS 0.05 % ophthalmic emulsion Place 1 drop into both eyes 2 (two) times daily. 06/29/22  Yes [provider]  rosuvastatin  (CRESTOR ) 40 MG tablet Take 1 tablet (40 mg total) by mouth daily. 09/10/23 01/13/24  Alvan Dorothyann BIRCH, MD    Family History Family History  Problem Relation Age  of Onset   Diabetes Mother    Hypertension Mother    Atrial fibrillation Father    Kidney disease Father    Diabetes Other     Social History Social History   Tobacco Use   Smoking status: Former    Current packs/day: 0.00    Average packs/day: 1 pack/day for 15.0 years (15.0 ttl pk-yrs)    Types: Cigarettes    Start date: 2002    Quit date: 02/21/2015    Years since quitting: 9.0   Smokeless tobacco: Never  Vaping Use   Vaping status: Never Used  Substance Use Topics   Alcohol use: Not Currently    Alcohol/week: 2.0 standard drinks of alcohol    Types: 2 Shots of liquor per week    Comment: One to two mixed drinks per month   Drug use: Never     Allergies   Niacin   Review of  Systems Review of Systems + sore throat, resolved + cough No pleuritic pain ? wheezing + nasal congestion + post-nasal drainage No sinus pain/pressure No itchy/red eyes No earache No hemoptysis No SOB No fever, + sweats No nausea No vomiting No abdominal pain No diarrhea No urinary symptoms No skin rash + fatigue No myalgias No headache Used OTC meds (Coricidin)without relief   Physical Exam Triage Vital Signs ED Triage Vitals  Encounter Vitals      BP 03/06/24 1109 127/76     Girls Systolic BP Percentile --      Girls Diastolic BP Percentile --      Boys Systolic BP Percentile --      Boys Diastolic BP Percentile --      Pulse Rate 03/06/24 1109 84     Resp 03/06/24 1109 18     Temp 03/06/24 1109 98.3 F (36.8 C)     Temp Source 03/06/24 1109 Oral     SpO2 03/06/24 1109 97 %     Weight 03/06/24 1108 218 lb (98.9 kg)     Height 03/06/24 1108 6' (1.829 m)     Head Circumference --      Peak Flow --      Pain Score 03/06/24 1108 0     Pain Loc --      Pain Education --      Exclude from Growth Chart --    No data found.  Updated Vital Signs BP 127/76 (BP Location: Right Arm)   Pulse 84   Temp 98.3 F (36.8 C) (Oral)   Resp 18   Ht 6' (1.829 m)   Wt 98.9 kg   SpO2 97%   BMI 29.57 kg/m   Visual Acuity Right Eye Distance:   Left Eye Distance:   Bilateral Distance:    Right Eye Near:   Left Eye Near:    Bilateral Near:     Physical Exam Nursing notes and Vital Signs reviewed. Appearance:  Patient appears stated age, and in no acute distress Eyes:  Pupils are equal, round, and reactive to light and accomodation.  Extraocular movement is intact.  Conjunctivae are not inflamed  Ears:  Canals normal.  Tympanic membranes normal.  Nose:  Congested turbinates.  No sinus tenderness. Pharynx:  Normal Neck:  Supple.  Mildly enlarged lateral nodes are present, tender to palpation on the left.   Lungs: Bibasilar wheezes present posteriorly.  Breath sounds  are equal.  Moving air well. Heart:  Regular rate and rhythm without murmurs, rubs, or gallops.  Abdomen:  Nontender  without masses or hepatosplenomegaly.  Bowel sounds are present.  No CVA or flank tenderness.  Extremities:  No edema.  Skin:  No rash present.   UC Treatments / Results  Labs (all labs ordered are listed, but only abnormal results are displayed) Labs Reviewed - No data to display  EKG   Radiology No results found.  Procedures Procedures (including critical care time)  Medications Ordered in UC Medications - No data to display  Initial Impression / Assessment and Plan / UC Course  I have reviewed the triage vital signs and the nursing notes.  Pertinent labs & imaging results that were available during my care of the patient were reviewed by me and considered in my medical decision making (see chart for details).    Begin Augmentin  875 Q12hr for one week, and prednisone  burst/taper. Followup with Family Doctor if not improved in one week.   Final Clinical Impressions(s) / UC Diagnoses   Final diagnoses:  Acute bronchitis, unspecified organism     Discharge Instructions      Take plain guaifenesin (1200mg  extended release tabs such as Mucinex) twice daily, with plenty of water, for cough and congestion. Get adequate rest.   May use Afrin nasal spray (or generic oxymetazoline) each morning for about 5 days and then discontinue.  Also recommend using saline nasal spray several times daily and saline nasal irrigation (AYR is a common brand).  Use Flonase nasal spray each morning after using Afrin nasal spray and saline nasal irrigation. May take Delsym Cough Suppressant (12 Hour Cough Relief) at bedtime for nighttime cough.  Stop all antihistamines (Coricidin, etc) for now, and other non-prescription cough/cold preparations.   If symptoms become significantly worse during the night or over the weekend, proceed to the local emergency room.       ED  Prescriptions     Medication Sig Dispense Auth. Provider   amoxicillin -clavulanate (AUGMENTIN ) 875-125 MG tablet Take one tab PO Q12hr with food 14 tablet Pauline Garnette LABOR, MD   predniSONE  (DELTASONE ) 20 MG tablet Take one tab by mouth twice daily for 4 days, then one daily for 3 days. Take with food. 11 tablet Pauline Garnette LABOR, MD         Pauline Garnette LABOR, MD 03/07/24 [phone]

## 2024-03-16 ENCOUNTER — Telehealth: Payer: Self-pay | Admitting: Urgent Care

## 2024-03-16 DIAGNOSIS — E034 Atrophy of thyroid (acquired): Secondary | ICD-10-CM

## 2024-03-16 NOTE — Telephone Encounter (Unsigned)
 Copied from CRM #[id number]. Topic: Clinical - Medication Refill >> Mar 16, 2024  1:23 PM Vivian Z wrote: Medication: levothyroxine (SYNTHROID) 88 MCG tablet  Has the patient contacted their pharmacy? Yes (Agent: If no, request that the patient contact the pharmacy for the refill. If patient does not wish to contact the pharmacy document the reason why and proceed with request.) (Agent: If yes, when and what did the pharmacy advise?)  This is the patient's preferred pharmacy:  [id number] [address], [place] - [address] [facility], [place] [address] Phone: [phone] Fax: [phone]  Is this the correct pharmacy for this prescription? Yes If no, delete pharmacy and type the correct one.   Has the prescription been filled recently? No  Is the patient out of the medication? No  Has the patient been seen for an appointment in the last year OR does the patient have an upcoming appointment? Yes  Can we respond through MyChart? Yes  Agent: Please be advised that Rx refills may take up to 3 business days. We ask that you follow-up with your pharmacy.

## 2024-03-17 ENCOUNTER — Other Ambulatory Visit: Payer: Self-pay | Admitting: *Deleted

## 2024-03-17 DIAGNOSIS — E034 Atrophy of thyroid (acquired): Secondary | ICD-10-CM

## 2024-03-17 MED ORDER — LEVOTHYROXINE SODIUM 88 MCG PO TABS: 88.0000 ug | ORAL_TABLET | Freq: Every day | ORAL | 1 refills | Status: AC

## 2024-04-11 ENCOUNTER — Other Ambulatory Visit: Payer: Self-pay | Admitting: Family Medicine

## 2024-04-11 DIAGNOSIS — I10 Essential (primary) hypertension: Secondary | ICD-10-CM

## 2024-05-14 ENCOUNTER — Other Ambulatory Visit: Payer: Self-pay | Admitting: Family Medicine

## 2024-07-10 ENCOUNTER — Ambulatory Visit: Admitting: Urgent Care

## 2024-07-13 ENCOUNTER — Ambulatory Visit: Admitting: Urgent Care

## 2024-10-15 ENCOUNTER — Ambulatory Visit
